# Patient Record
Sex: Female | Born: 1945 | Race: Black or African American | Hispanic: No | Smoking: Never smoker
Health system: Southern US, Community
[De-identification: ages and names within clinical notes are randomized; demographics above are authoritative.]

## PROBLEM LIST (undated history)

## (undated) DIAGNOSIS — R4701 Aphasia: Secondary | ICD-10-CM

## (undated) DIAGNOSIS — I639 Cerebral infarction, unspecified: Secondary | ICD-10-CM

## (undated) DIAGNOSIS — R531 Weakness: Secondary | ICD-10-CM

## (undated) DIAGNOSIS — I10 Essential (primary) hypertension: Secondary | ICD-10-CM

## (undated) DIAGNOSIS — I719 Aortic aneurysm of unspecified site, without rupture: Secondary | ICD-10-CM

## (undated) HISTORY — DX: Essential (primary) hypertension: I10

## (undated) HISTORY — DX: Cerebral infarction, unspecified: I63.9

## (undated) HISTORY — PX: ABDOMINAL AORTIC ANEURYSM REPAIR: SHX42

## (undated) HISTORY — DX: Aortic aneurysm of unspecified site, without rupture: I71.9

---

## 2017-01-09 DIAGNOSIS — R938 Abnormal findings on diagnostic imaging of other specified body structures: Secondary | ICD-10-CM | POA: Diagnosis not present

## 2017-01-30 DIAGNOSIS — E876 Hypokalemia: Secondary | ICD-10-CM | POA: Diagnosis not present

## 2017-01-30 DIAGNOSIS — M810 Age-related osteoporosis without current pathological fracture: Secondary | ICD-10-CM | POA: Diagnosis not present

## 2017-01-30 DIAGNOSIS — R918 Other nonspecific abnormal finding of lung field: Secondary | ICD-10-CM | POA: Diagnosis not present

## 2017-01-30 DIAGNOSIS — I82409 Acute embolism and thrombosis of unspecified deep veins of unspecified lower extremity: Secondary | ICD-10-CM | POA: Diagnosis not present

## 2017-01-30 DIAGNOSIS — G40909 Epilepsy, unspecified, not intractable, without status epilepticus: Secondary | ICD-10-CM | POA: Diagnosis not present

## 2017-01-30 DIAGNOSIS — I639 Cerebral infarction, unspecified: Secondary | ICD-10-CM | POA: Diagnosis not present

## 2017-01-30 DIAGNOSIS — E079 Disorder of thyroid, unspecified: Secondary | ICD-10-CM | POA: Diagnosis not present

## 2017-01-30 DIAGNOSIS — N189 Chronic kidney disease, unspecified: Secondary | ICD-10-CM | POA: Diagnosis not present

## 2017-01-30 DIAGNOSIS — Z79899 Other long term (current) drug therapy: Secondary | ICD-10-CM | POA: Diagnosis not present

## 2017-01-30 DIAGNOSIS — K922 Gastrointestinal hemorrhage, unspecified: Secondary | ICD-10-CM | POA: Diagnosis not present

## 2017-01-30 DIAGNOSIS — J9811 Atelectasis: Secondary | ICD-10-CM | POA: Diagnosis not present

## 2017-01-30 DIAGNOSIS — I129 Hypertensive chronic kidney disease with stage 1 through stage 4 chronic kidney disease, or unspecified chronic kidney disease: Secondary | ICD-10-CM | POA: Diagnosis not present

## 2017-01-30 DIAGNOSIS — L309 Dermatitis, unspecified: Secondary | ICD-10-CM | POA: Diagnosis not present

## 2017-01-30 DIAGNOSIS — Z87891 Personal history of nicotine dependence: Secondary | ICD-10-CM | POA: Diagnosis not present

## 2017-01-30 DIAGNOSIS — E785 Hyperlipidemia, unspecified: Secondary | ICD-10-CM | POA: Diagnosis not present

## 2017-06-26 DIAGNOSIS — R1312 Dysphagia, oropharyngeal phase: Secondary | ICD-10-CM | POA: Diagnosis not present

## 2017-06-26 DIAGNOSIS — I69391 Dysphagia following cerebral infarction: Secondary | ICD-10-CM | POA: Diagnosis not present

## 2017-07-01 DIAGNOSIS — I69351 Hemiplegia and hemiparesis following cerebral infarction affecting right dominant side: Secondary | ICD-10-CM | POA: Diagnosis not present

## 2017-07-01 DIAGNOSIS — Z681 Body mass index (BMI) 19 or less, adult: Secondary | ICD-10-CM | POA: Diagnosis not present

## 2017-07-01 DIAGNOSIS — M25561 Pain in right knee: Secondary | ICD-10-CM | POA: Diagnosis not present

## 2017-07-01 DIAGNOSIS — S72421A Displaced fracture of lateral condyle of right femur, initial encounter for closed fracture: Secondary | ICD-10-CM | POA: Diagnosis not present

## 2017-07-01 DIAGNOSIS — K259 Gastric ulcer, unspecified as acute or chronic, without hemorrhage or perforation: Secondary | ICD-10-CM | POA: Diagnosis not present

## 2017-07-01 DIAGNOSIS — M25461 Effusion, right knee: Secondary | ICD-10-CM | POA: Diagnosis not present

## 2017-07-01 DIAGNOSIS — J189 Pneumonia, unspecified organism: Secondary | ICD-10-CM | POA: Diagnosis not present

## 2017-07-01 DIAGNOSIS — R109 Unspecified abdominal pain: Secondary | ICD-10-CM | POA: Diagnosis not present

## 2017-07-01 DIAGNOSIS — E43 Unspecified severe protein-calorie malnutrition: Secondary | ICD-10-CM | POA: Diagnosis not present

## 2017-07-01 DIAGNOSIS — J159 Unspecified bacterial pneumonia: Secondary | ICD-10-CM | POA: Diagnosis not present

## 2017-07-01 DIAGNOSIS — R1084 Generalized abdominal pain: Secondary | ICD-10-CM | POA: Diagnosis not present

## 2017-07-01 DIAGNOSIS — X58XXXA Exposure to other specified factors, initial encounter: Secondary | ICD-10-CM | POA: Diagnosis not present

## 2017-07-01 DIAGNOSIS — J181 Lobar pneumonia, unspecified organism: Secondary | ICD-10-CM | POA: Diagnosis not present

## 2017-07-01 DIAGNOSIS — R1010 Upper abdominal pain, unspecified: Secondary | ICD-10-CM | POA: Diagnosis not present

## 2017-07-02 DIAGNOSIS — E876 Hypokalemia: Secondary | ICD-10-CM | POA: Diagnosis present

## 2017-07-02 DIAGNOSIS — R1312 Dysphagia, oropharyngeal phase: Secondary | ICD-10-CM | POA: Diagnosis not present

## 2017-07-02 DIAGNOSIS — Z7401 Bed confinement status: Secondary | ICD-10-CM | POA: Diagnosis not present

## 2017-07-02 DIAGNOSIS — X58XXXA Exposure to other specified factors, initial encounter: Secondary | ICD-10-CM | POA: Diagnosis not present

## 2017-07-02 DIAGNOSIS — B9681 Helicobacter pylori [H. pylori] as the cause of diseases classified elsewhere: Secondary | ICD-10-CM | POA: Diagnosis present

## 2017-07-02 DIAGNOSIS — R938 Abnormal findings on diagnostic imaging of other specified body structures: Secondary | ICD-10-CM | POA: Diagnosis not present

## 2017-07-02 DIAGNOSIS — S72421A Displaced fracture of lateral condyle of right femur, initial encounter for closed fracture: Secondary | ICD-10-CM | POA: Diagnosis present

## 2017-07-02 DIAGNOSIS — A048 Other specified bacterial intestinal infections: Secondary | ICD-10-CM | POA: Diagnosis not present

## 2017-07-02 DIAGNOSIS — R634 Abnormal weight loss: Secondary | ICD-10-CM | POA: Diagnosis not present

## 2017-07-02 DIAGNOSIS — K269 Duodenal ulcer, unspecified as acute or chronic, without hemorrhage or perforation: Secondary | ICD-10-CM | POA: Diagnosis present

## 2017-07-02 DIAGNOSIS — S72411A Displaced unspecified condyle fracture of lower end of right femur, initial encounter for closed fracture: Secondary | ICD-10-CM | POA: Diagnosis not present

## 2017-07-02 DIAGNOSIS — Z681 Body mass index (BMI) 19 or less, adult: Secondary | ICD-10-CM | POA: Diagnosis not present

## 2017-07-02 DIAGNOSIS — R112 Nausea with vomiting, unspecified: Secondary | ICD-10-CM | POA: Diagnosis not present

## 2017-07-02 DIAGNOSIS — I10 Essential (primary) hypertension: Secondary | ICD-10-CM | POA: Diagnosis present

## 2017-07-02 DIAGNOSIS — R195 Other fecal abnormalities: Secondary | ICD-10-CM | POA: Diagnosis not present

## 2017-07-02 DIAGNOSIS — K828 Other specified diseases of gallbladder: Secondary | ICD-10-CM | POA: Diagnosis present

## 2017-07-02 DIAGNOSIS — J159 Unspecified bacterial pneumonia: Secondary | ICD-10-CM | POA: Diagnosis present

## 2017-07-02 DIAGNOSIS — R1084 Generalized abdominal pain: Secondary | ICD-10-CM | POA: Diagnosis not present

## 2017-07-02 DIAGNOSIS — I69820 Aphasia following other cerebrovascular disease: Secondary | ICD-10-CM | POA: Diagnosis not present

## 2017-07-02 DIAGNOSIS — S72411D Displaced unspecified condyle fracture of lower end of right femur, subsequent encounter for closed fracture with routine healing: Secondary | ICD-10-CM | POA: Diagnosis not present

## 2017-07-02 DIAGNOSIS — R630 Anorexia: Secondary | ICD-10-CM | POA: Diagnosis not present

## 2017-07-02 DIAGNOSIS — R1010 Upper abdominal pain, unspecified: Secondary | ICD-10-CM | POA: Diagnosis not present

## 2017-07-02 DIAGNOSIS — I69351 Hemiplegia and hemiparesis following cerebral infarction affecting right dominant side: Secondary | ICD-10-CM | POA: Diagnosis not present

## 2017-07-02 DIAGNOSIS — Z8673 Personal history of transient ischemic attack (TIA), and cerebral infarction without residual deficits: Secondary | ICD-10-CM | POA: Diagnosis not present

## 2017-07-02 DIAGNOSIS — R41841 Cognitive communication deficit: Secondary | ICD-10-CM | POA: Diagnosis not present

## 2017-07-02 DIAGNOSIS — Z86718 Personal history of other venous thrombosis and embolism: Secondary | ICD-10-CM | POA: Diagnosis not present

## 2017-07-02 DIAGNOSIS — M6281 Muscle weakness (generalized): Secondary | ICD-10-CM | POA: Diagnosis not present

## 2017-07-02 DIAGNOSIS — I69320 Aphasia following cerebral infarction: Secondary | ICD-10-CM | POA: Diagnosis not present

## 2017-07-02 DIAGNOSIS — M25561 Pain in right knee: Secondary | ICD-10-CM | POA: Diagnosis not present

## 2017-07-02 DIAGNOSIS — R1011 Right upper quadrant pain: Secondary | ICD-10-CM | POA: Diagnosis not present

## 2017-07-02 DIAGNOSIS — Z87891 Personal history of nicotine dependence: Secondary | ICD-10-CM | POA: Diagnosis not present

## 2017-07-02 DIAGNOSIS — E785 Hyperlipidemia, unspecified: Secondary | ICD-10-CM | POA: Diagnosis present

## 2017-07-02 DIAGNOSIS — R6881 Early satiety: Secondary | ICD-10-CM | POA: Diagnosis not present

## 2017-07-02 DIAGNOSIS — J181 Lobar pneumonia, unspecified organism: Secondary | ICD-10-CM | POA: Diagnosis not present

## 2017-07-02 DIAGNOSIS — J189 Pneumonia, unspecified organism: Secondary | ICD-10-CM | POA: Diagnosis not present

## 2017-07-02 DIAGNOSIS — K259 Gastric ulcer, unspecified as acute or chronic, without hemorrhage or perforation: Secondary | ICD-10-CM | POA: Diagnosis present

## 2017-07-02 DIAGNOSIS — M25469 Effusion, unspecified knee: Secondary | ICD-10-CM | POA: Diagnosis not present

## 2017-07-02 DIAGNOSIS — R109 Unspecified abdominal pain: Secondary | ICD-10-CM | POA: Diagnosis not present

## 2017-07-02 DIAGNOSIS — R2689 Other abnormalities of gait and mobility: Secondary | ICD-10-CM | POA: Diagnosis not present

## 2017-07-02 DIAGNOSIS — E43 Unspecified severe protein-calorie malnutrition: Secondary | ICD-10-CM | POA: Diagnosis present

## 2017-07-02 DIAGNOSIS — R932 Abnormal findings on diagnostic imaging of liver and biliary tract: Secondary | ICD-10-CM | POA: Diagnosis not present

## 2017-07-07 DIAGNOSIS — I69320 Aphasia following cerebral infarction: Secondary | ICD-10-CM | POA: Diagnosis not present

## 2017-07-07 DIAGNOSIS — R634 Abnormal weight loss: Secondary | ICD-10-CM | POA: Diagnosis not present

## 2017-07-07 DIAGNOSIS — Z7401 Bed confinement status: Secondary | ICD-10-CM | POA: Diagnosis not present

## 2017-07-07 DIAGNOSIS — S72414A Nondisplaced unspecified condyle fracture of lower end of right femur, initial encounter for closed fracture: Secondary | ICD-10-CM | POA: Diagnosis not present

## 2017-07-07 DIAGNOSIS — R1312 Dysphagia, oropharyngeal phase: Secondary | ICD-10-CM | POA: Diagnosis not present

## 2017-07-07 DIAGNOSIS — R938 Abnormal findings on diagnostic imaging of other specified body structures: Secondary | ICD-10-CM | POA: Diagnosis not present

## 2017-07-07 DIAGNOSIS — S72411D Displaced unspecified condyle fracture of lower end of right femur, subsequent encounter for closed fracture with routine healing: Secondary | ICD-10-CM | POA: Diagnosis not present

## 2017-07-07 DIAGNOSIS — A048 Other specified bacterial intestinal infections: Secondary | ICD-10-CM | POA: Diagnosis not present

## 2017-07-07 DIAGNOSIS — Z86718 Personal history of other venous thrombosis and embolism: Secondary | ICD-10-CM | POA: Diagnosis not present

## 2017-07-07 DIAGNOSIS — E785 Hyperlipidemia, unspecified: Secondary | ICD-10-CM | POA: Diagnosis not present

## 2017-07-07 DIAGNOSIS — S72411A Displaced unspecified condyle fracture of lower end of right femur, initial encounter for closed fracture: Secondary | ICD-10-CM | POA: Diagnosis not present

## 2017-07-07 DIAGNOSIS — M6281 Muscle weakness (generalized): Secondary | ICD-10-CM | POA: Diagnosis not present

## 2017-07-07 DIAGNOSIS — I10 Essential (primary) hypertension: Secondary | ICD-10-CM | POA: Diagnosis not present

## 2017-07-07 DIAGNOSIS — J159 Unspecified bacterial pneumonia: Secondary | ICD-10-CM | POA: Diagnosis not present

## 2017-07-07 DIAGNOSIS — R2689 Other abnormalities of gait and mobility: Secondary | ICD-10-CM | POA: Diagnosis not present

## 2017-07-07 DIAGNOSIS — J181 Lobar pneumonia, unspecified organism: Secondary | ICD-10-CM | POA: Diagnosis not present

## 2017-07-07 DIAGNOSIS — R41841 Cognitive communication deficit: Secondary | ICD-10-CM | POA: Diagnosis not present

## 2017-07-07 DIAGNOSIS — Z8673 Personal history of transient ischemic attack (TIA), and cerebral infarction without residual deficits: Secondary | ICD-10-CM | POA: Diagnosis not present

## 2017-07-07 DIAGNOSIS — R1084 Generalized abdominal pain: Secondary | ICD-10-CM | POA: Diagnosis not present

## 2017-07-22 DIAGNOSIS — R634 Abnormal weight loss: Secondary | ICD-10-CM | POA: Diagnosis not present

## 2017-07-22 DIAGNOSIS — J181 Lobar pneumonia, unspecified organism: Secondary | ICD-10-CM | POA: Diagnosis not present

## 2017-07-22 DIAGNOSIS — A048 Other specified bacterial intestinal infections: Secondary | ICD-10-CM | POA: Diagnosis not present

## 2017-07-22 DIAGNOSIS — S72414A Nondisplaced unspecified condyle fracture of lower end of right femur, initial encounter for closed fracture: Secondary | ICD-10-CM | POA: Diagnosis not present

## 2017-07-24 DIAGNOSIS — Z87891 Personal history of nicotine dependence: Secondary | ICD-10-CM | POA: Diagnosis not present

## 2017-07-24 DIAGNOSIS — I69351 Hemiplegia and hemiparesis following cerebral infarction affecting right dominant side: Secondary | ICD-10-CM | POA: Diagnosis not present

## 2017-07-24 DIAGNOSIS — Z8701 Personal history of pneumonia (recurrent): Secondary | ICD-10-CM | POA: Diagnosis not present

## 2017-07-24 DIAGNOSIS — I69320 Aphasia following cerebral infarction: Secondary | ICD-10-CM | POA: Diagnosis not present

## 2017-07-24 DIAGNOSIS — Z7982 Long term (current) use of aspirin: Secondary | ICD-10-CM | POA: Diagnosis not present

## 2017-07-24 DIAGNOSIS — Z9181 History of falling: Secondary | ICD-10-CM | POA: Diagnosis not present

## 2017-07-24 DIAGNOSIS — Z86718 Personal history of other venous thrombosis and embolism: Secondary | ICD-10-CM | POA: Diagnosis not present

## 2017-07-24 DIAGNOSIS — R569 Unspecified convulsions: Secondary | ICD-10-CM | POA: Diagnosis not present

## 2017-07-24 DIAGNOSIS — S72421D Displaced fracture of lateral condyle of right femur, subsequent encounter for closed fracture with routine healing: Secondary | ICD-10-CM | POA: Diagnosis not present

## 2017-07-24 DIAGNOSIS — Z993 Dependence on wheelchair: Secondary | ICD-10-CM | POA: Diagnosis not present

## 2017-07-24 DIAGNOSIS — R32 Unspecified urinary incontinence: Secondary | ICD-10-CM | POA: Diagnosis not present

## 2017-07-28 DIAGNOSIS — Z7982 Long term (current) use of aspirin: Secondary | ICD-10-CM | POA: Diagnosis not present

## 2017-07-28 DIAGNOSIS — I69320 Aphasia following cerebral infarction: Secondary | ICD-10-CM | POA: Diagnosis not present

## 2017-07-28 DIAGNOSIS — I69351 Hemiplegia and hemiparesis following cerebral infarction affecting right dominant side: Secondary | ICD-10-CM | POA: Diagnosis not present

## 2017-07-28 DIAGNOSIS — S72421D Displaced fracture of lateral condyle of right femur, subsequent encounter for closed fracture with routine healing: Secondary | ICD-10-CM | POA: Diagnosis not present

## 2017-07-28 DIAGNOSIS — R32 Unspecified urinary incontinence: Secondary | ICD-10-CM | POA: Diagnosis not present

## 2017-07-28 DIAGNOSIS — R569 Unspecified convulsions: Secondary | ICD-10-CM | POA: Diagnosis not present

## 2017-07-29 DIAGNOSIS — S72421D Displaced fracture of lateral condyle of right femur, subsequent encounter for closed fracture with routine healing: Secondary | ICD-10-CM | POA: Diagnosis not present

## 2017-07-29 DIAGNOSIS — Z7982 Long term (current) use of aspirin: Secondary | ICD-10-CM | POA: Diagnosis not present

## 2017-07-29 DIAGNOSIS — R32 Unspecified urinary incontinence: Secondary | ICD-10-CM | POA: Diagnosis not present

## 2017-07-29 DIAGNOSIS — I69351 Hemiplegia and hemiparesis following cerebral infarction affecting right dominant side: Secondary | ICD-10-CM | POA: Diagnosis not present

## 2017-07-29 DIAGNOSIS — R569 Unspecified convulsions: Secondary | ICD-10-CM | POA: Diagnosis not present

## 2017-07-29 DIAGNOSIS — I69320 Aphasia following cerebral infarction: Secondary | ICD-10-CM | POA: Diagnosis not present

## 2017-07-31 DIAGNOSIS — R32 Unspecified urinary incontinence: Secondary | ICD-10-CM | POA: Diagnosis not present

## 2017-07-31 DIAGNOSIS — Z7982 Long term (current) use of aspirin: Secondary | ICD-10-CM | POA: Diagnosis not present

## 2017-07-31 DIAGNOSIS — R569 Unspecified convulsions: Secondary | ICD-10-CM | POA: Diagnosis not present

## 2017-07-31 DIAGNOSIS — S72421D Displaced fracture of lateral condyle of right femur, subsequent encounter for closed fracture with routine healing: Secondary | ICD-10-CM | POA: Diagnosis not present

## 2017-07-31 DIAGNOSIS — I69351 Hemiplegia and hemiparesis following cerebral infarction affecting right dominant side: Secondary | ICD-10-CM | POA: Diagnosis not present

## 2017-07-31 DIAGNOSIS — I69320 Aphasia following cerebral infarction: Secondary | ICD-10-CM | POA: Diagnosis not present

## 2017-12-22 ENCOUNTER — Ambulatory Visit: Payer: Self-pay | Admitting: Family Medicine

## 2017-12-23 ENCOUNTER — Ambulatory Visit (INDEPENDENT_AMBULATORY_CARE_PROVIDER_SITE_OTHER): Payer: Medicare Other | Admitting: Family Medicine

## 2017-12-23 ENCOUNTER — Encounter: Payer: Self-pay | Admitting: Family Medicine

## 2017-12-23 VITALS — BP 138/82 | HR 62 | Resp 16 | Ht 65.0 in | Wt 125.0 lb

## 2017-12-23 DIAGNOSIS — I69351 Hemiplegia and hemiparesis following cerebral infarction affecting right dominant side: Secondary | ICD-10-CM | POA: Diagnosis not present

## 2017-12-23 DIAGNOSIS — I10 Essential (primary) hypertension: Secondary | ICD-10-CM | POA: Diagnosis not present

## 2017-12-23 DIAGNOSIS — R531 Weakness: Secondary | ICD-10-CM

## 2017-12-23 DIAGNOSIS — IMO0001 Reserved for inherently not codable concepts without codable children: Secondary | ICD-10-CM | POA: Insufficient documentation

## 2017-12-23 DIAGNOSIS — E785 Hyperlipidemia, unspecified: Secondary | ICD-10-CM

## 2017-12-23 DIAGNOSIS — R2681 Unsteadiness on feet: Secondary | ICD-10-CM | POA: Diagnosis not present

## 2017-12-23 MED ORDER — ATORVASTATIN CALCIUM 40 MG PO TABS: 40.0000 mg | ORAL_TABLET | Freq: Every day | ORAL | 2 refills | Status: DC

## 2017-12-23 MED ORDER — LISINOPRIL-HYDROCHLOROTHIAZIDE 10-12.5 MG PO TABS: 1.0000 | ORAL_TABLET | Freq: Every day | ORAL | 2 refills | Status: DC

## 2017-12-24 LAB — COMPREHENSIVE METABOLIC PANEL
A/G RATIO: 1.1 — AB (ref 1.2–2.2)
ALK PHOS: 152 IU/L — AB (ref 39–117)
ALT: 15 IU/L (ref 0–32)
AST: 21 IU/L (ref 0–40)
Albumin: 4.8 g/dL (ref 3.5–4.8)
BILIRUBIN TOTAL: 0.4 mg/dL (ref 0.0–1.2)
BUN / CREAT RATIO: 15 (ref 12–28)
BUN: 9 mg/dL (ref 8–27)
CHLORIDE: 100 mmol/L (ref 96–106)
CO2: 25 mmol/L (ref 20–29)
Calcium: 10.3 mg/dL (ref 8.7–10.3)
Creatinine, Ser: 0.61 mg/dL (ref 0.57–1.00)
GFR calc non Af Amer: 90 mL/min/{1.73_m2} (ref >59)
GFR, EST AFRICAN AMERICAN: 104 mL/min/{1.73_m2} (ref >59)
Globulin, Total: 4.3 g/dL (ref 1.5–4.5)
Glucose: 83 mg/dL (ref 65–99)
POTASSIUM: 4.1 mmol/L (ref 3.5–5.2)
Sodium: 142 mmol/L (ref 134–144)
TOTAL PROTEIN: 9.1 g/dL — AB (ref 6.0–8.5)

## 2017-12-24 LAB — TSH: TSH: 1.77 u[IU]/mL (ref 0.450–4.500)

## 2017-12-24 LAB — CBC

## 2017-12-24 LAB — LIPID PANEL
CHOLESTEROL TOTAL: 384 mg/dL — AB (ref 100–199)
Chol/HDL Ratio: 4.3 ratio (ref 0.0–4.4)
HDL: 89 mg/dL (ref >39)
LDL Calculated: 257 mg/dL — ABNORMAL HIGH (ref 0–99)
Triglycerides: 190 mg/dL — ABNORMAL HIGH (ref 0–149)
VLDL Cholesterol Cal: 38 mg/dL (ref 5–40)

## 2017-12-24 LAB — VITAMIN B12: Vitamin B-12: 921 pg/mL (ref 232–1245)

## 2017-12-24 NOTE — Progress Notes (Signed)
Date:  12/23/2017   Name:  Belinda Hansen   DOB:  01/17/1945   MRN:  [id number]  PCP:  Adline Potter, MD    Chief Complaint: Establish Care   History of Present Illness:  This is a 72 y.o. female seen for initial visit. Previous care at [facility]. 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 On no asa or other blood thinners. HTN on Prinzide and HLD on Lipitor x yrs. No other issues noted but pt is poor historian and nothing on Care Everywhere. Declines flu, tet, pneumo, and zoster imms. Unsure if ever had colonoscopy or mammogram.  Review of Systems:  Review of Systems  Constitutional: Negative for chills and fever.  Respiratory: Negative for cough and shortness of breath.   Cardiovascular: Negative for chest pain and leg swelling.  Gastrointestinal: Negative for abdominal pain, constipation and diarrhea.  Genitourinary: Negative for difficulty urinating.  Neurological: Negative for syncope and light-headedness.    Patient Active Problem List   Diagnosis Date Noted  . Hemiparesis affecting right side as late effect of cerebrovascular accident (CVA) ([place]) 12/23/2017  . Hypertension 12/23/2017  . Hyperlipidemia 12/23/2017  . Gait instability 12/23/2017    Prior to Admission medications   Medication Sig Start Date End Date Taking? Authorizing Provider  aspirin 81 MG tablet Take 81 mg by mouth daily.   Yes [provider]  atorvastatin (LIPITOR) 40 MG tablet Take 1 tablet (40 mg total) by mouth daily. 12/23/17  Yes Castor Gittleman, Gwyndolyn Saxon, MD  lisinopril-hydrochlorothiazide (PRINZIDE,ZESTORETIC) 10-12.5 MG tablet Take 1 tablet by mouth daily. 12/23/17  Yes Andi Mahaffy, Gwyndolyn Saxon, MD    No Known Allergies  Past Surgical History:  Procedure Laterality Date  . ABDOMINAL AORTIC ANEURYSM REPAIR      Social History   Tobacco Use  . Smoking status: Never Smoker  . Smokeless tobacco: Never Used  Substance Use Topics  . Alcohol use: No    Frequency: Never  . Drug use: No     Family History  Family history unknown: Yes    Medication list has been reviewed and updated.  Physical Examination: BP 138/82   Pulse 62   Resp 16   Ht 5\' 5"  (1.651 m)   Wt 125 lb (56.7 kg)   SpO2 98%   BMI 20.80 kg/m   Physical Exam  Constitutional: She appears well-developed and well-nourished.  Generally weak  HENT:  Head: Normocephalic and atraumatic.  Right Ear: External ear normal.  Nose: Nose normal.  Mouth/Throat: Oropharynx is clear and moist.  L EAC occluded by cerumen  Eyes: Conjunctivae and EOM are normal. Pupils are equal, round, and reactive to light.  Neck: Neck supple. No thyromegaly present.  Cardiovascular: Normal rate, regular rhythm and normal heart sounds.  Pulmonary/Chest: Effort normal and breath sounds normal.  Abdominal: Soft. She exhibits no distension. There is no tenderness.  Musculoskeletal: She exhibits no edema.  Lymphadenopathy:    She has no cervical adenopathy.  Neurological: She is alert.  Dense R hemiparesis No tremor, unable to ambulate  Skin: Skin is warm and dry.  Psychiatric: She has a normal mood and affect. Her behavior is normal.  Nursing note and vitals reviewed.   Assessment and Plan:  1. Hemiparesis affecting right side as late effect of cerebrovascular accident (CVA) ([place]) Stable x 20 yrs, add asa  2. Essential hypertension Adequate control on Prinzide, refill - Comprehensive Metabolic Panel (CMET) - CBC  3. Hyperlipidemia, unspecified hyperlipidemia type Unclear control on Lipitor, refill -  Lipid Profile  4. Gait instability - B12  5. Weakness - TSH  Return in about 4 weeks (around 01/20/2018).   45 mins spent with patient over half in counseling  Satira Anis. [facility]  12/24/2017

## 2018-01-21 ENCOUNTER — Ambulatory Visit: Payer: Medicare Other | Admitting: Family Medicine

## 2018-02-25 ENCOUNTER — Other Ambulatory Visit: Payer: Self-pay

## 2018-02-25 MED ORDER — LISINOPRIL-HYDROCHLOROTHIAZIDE 10-12.5 MG PO TABS: 1.0000 | ORAL_TABLET | Freq: Every day | ORAL | 2 refills | Status: DC

## 2018-02-25 MED ORDER — ATORVASTATIN CALCIUM 40 MG PO TABS: 40.0000 mg | ORAL_TABLET | Freq: Every day | ORAL | 2 refills | Status: DC

## 2018-04-07 ENCOUNTER — Telehealth: Payer: Self-pay

## 2018-04-07 ENCOUNTER — Other Ambulatory Visit: Payer: Self-pay | Admitting: Family Medicine

## 2018-04-07 MED ORDER — LISINOPRIL-HYDROCHLOROTHIAZIDE 10-12.5 MG PO TABS: 1.0000 | ORAL_TABLET | Freq: Every day | ORAL | 2 refills | Status: DC

## 2018-04-07 MED ORDER — ATORVASTATIN CALCIUM 40 MG PO TABS: 40.0000 mg | ORAL_TABLET | Freq: Every day | ORAL | 2 refills | Status: DC

## 2018-04-07 NOTE — Telephone Encounter (Signed)
Done x 90d

## 2018-04-07 NOTE — Telephone Encounter (Signed)
Please send in all meds we prescribe or can prior to leaving today. Will set up new PCP today.

## 2018-05-07 ENCOUNTER — Encounter: Payer: Self-pay | Admitting: Internal Medicine

## 2018-05-07 ENCOUNTER — Ambulatory Visit (INDEPENDENT_AMBULATORY_CARE_PROVIDER_SITE_OTHER): Payer: Medicare Other | Admitting: Internal Medicine

## 2018-05-07 VITALS — BP 142/84 | HR 98 | Resp 16 | Ht 65.0 in | Wt 130.0 lb

## 2018-05-07 DIAGNOSIS — E785 Hyperlipidemia, unspecified: Secondary | ICD-10-CM | POA: Diagnosis not present

## 2018-05-07 DIAGNOSIS — R4701 Aphasia: Secondary | ICD-10-CM | POA: Diagnosis not present

## 2018-05-07 DIAGNOSIS — I69351 Hemiplegia and hemiparesis following cerebral infarction affecting right dominant side: Secondary | ICD-10-CM | POA: Diagnosis not present

## 2018-05-07 DIAGNOSIS — I499 Cardiac arrhythmia, unspecified: Secondary | ICD-10-CM | POA: Diagnosis not present

## 2018-05-07 DIAGNOSIS — I10 Essential (primary) hypertension: Secondary | ICD-10-CM | POA: Diagnosis not present

## 2018-05-07 DIAGNOSIS — I493 Ventricular premature depolarization: Secondary | ICD-10-CM | POA: Diagnosis not present

## 2018-05-07 MED ORDER — LISINOPRIL-HYDROCHLOROTHIAZIDE 10-12.5 MG PO TABS: 1.0000 | ORAL_TABLET | Freq: Every day | ORAL | 5 refills | Status: DC

## 2018-05-07 MED ORDER — ATORVASTATIN CALCIUM 40 MG PO TABS: 40.0000 mg | ORAL_TABLET | Freq: Every day | ORAL | 5 refills | Status: DC

## 2018-05-07 NOTE — Progress Notes (Signed)
Date:  05/07/2018   Name:  Belinda Hansen   DOB:  01/17/1945   MRN:  [id number]  Patient is here today with her nephew.  She lives independently with her sister. Chief Complaint: Hypertension and Hyperlipidemia Hypertension  This is a chronic problem. The problem is unchanged. The problem is controlled. Pertinent negatives include no chest pain, headaches, palpitations or shortness of breath. Past treatments include ACE inhibitors and diuretics. The current treatment provides significant improvement. Hypertensive end-organ damage includes kidney disease and CVA. There is no history of CAD/MI.  Hyperlipidemia  This is a chronic problem. Pertinent negatives include no chest pain or shortness of breath. Current antihyperlipidemic treatment includes statins (recently restarted meds). There are no compliance problems.   CVA - old stroke from 2007 in [place].  On aspirin.  Non ambulatory.  She was left with a dense right hemiparesis and speech impairment expressive aphasia.  She gets around with a scooter and is very independent.      Review of Systems  Constitutional: Negative for chills, fatigue and unexpected weight change.  Respiratory: Negative for chest tightness, shortness of breath and wheezing.   Cardiovascular: Negative for chest pain and palpitations.  Gastrointestinal: Negative for constipation.  Musculoskeletal: Positive for gait problem. Negative for arthralgias and joint swelling.  Skin: Negative for color change.  Allergic/Immunologic: Negative for environmental allergies.  Neurological: Negative for dizziness and headaches.  Hematological: Negative for adenopathy.  Psychiatric/Behavioral: Negative for dysphoric mood and sleep disturbance.    Patient Active Problem List   Diagnosis Date Noted  . 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 Hemiparesis affecting right side as late effect of cerebrovascular accident (CVA) ([place]) 12/23/2017  . Hypertension  12/23/2017  . Hyperlipidemia 12/23/2017  . Gait instability 12/23/2017    Prior to Admission medications   Medication Sig Start Date End Date Taking? Authorizing Provider  aspirin 81 MG tablet Take 81 mg by mouth daily.   Yes [provider]  atorvastatin (LIPITOR) 40 MG tablet Take 1 tablet (40 mg total) by mouth daily. 04/07/18  Yes Plonk, Gwyndolyn Saxon, MD  lisinopril-hydrochlorothiazide (PRINZIDE,ZESTORETIC) 10-12.5 MG tablet Take 1 tablet by mouth daily. 04/07/18  Yes Plonk, Gwyndolyn Saxon, MD    No Known Allergies  Past Surgical History:  Procedure Laterality Date  . ABDOMINAL AORTIC ANEURYSM REPAIR      Social History   Tobacco Use  . Smoking status: Never Smoker  . Smokeless tobacco: Never Used  Substance Use Topics  . Alcohol use: No    Frequency: Never  . Drug use: No     Medication list has been reviewed and updated.  Current Meds  Medication Sig  . aspirin 81 MG tablet Take 81 mg by mouth daily.  Marland Kitchen atorvastatin (LIPITOR) 40 MG tablet Take 1 tablet (40 mg total) by mouth daily.  Marland Kitchen lisinopril-hydrochlorothiazide (PRINZIDE,ZESTORETIC) 10-12.5 MG tablet Take 1 tablet by mouth daily.  . [DISCONTINUED] atorvastatin (LIPITOR) 40 MG tablet Take 1 tablet (40 mg total) by mouth daily.  . [DISCONTINUED] lisinopril-hydrochlorothiazide (PRINZIDE,ZESTORETIC) 10-12.5 MG tablet Take 1 tablet by mouth daily.    PHQ 2/9 Scores 05/07/2018 12/23/2017 12/23/2017  PHQ - 2 Score 0 0 0  Exception Documentation - Patient refusal -  Not completed - patient unable to explain patient unable to answer     Physical Exam  Constitutional: She is oriented to person, place, and time. She appears well-developed. No distress.  HENT:  Head: Normocephalic and atraumatic.  Cardiovascular: Normal heart sounds.  An irregularly irregular rhythm present.  No murmur heard. Pulses:      Dorsalis pedis pulses are 1+ on the left side.       Posterior tibial pulses are 1+ on the left side.  Pulmonary/Chest:  Effort normal and breath sounds normal. No respiratory distress. She has no wheezes. She has no rhonchi.  Abdominal: Soft. Normal appearance.  Musculoskeletal: Normal range of motion. She exhibits no edema.  Neurological: She is alert and oriented to person, place, and time. She displays atrophy (RUE and RLE).  Skin: Skin is warm and dry. No rash noted.  Changes of vitiligo  Psychiatric: Her behavior is normal. Thought content normal. Her affect is inappropriate (laughing more than appropriate). Her speech is slurred (with moderate expressive aphasia).  Nursing note and vitals reviewed.   BP (!) 142/84   Pulse 98   Resp 16   Ht 5\' 5"  (1.651 m)   Wt 130 lb (59 kg) Comment: patient reports  SpO2 97%   BMI 21.63 kg/m   Assessment and Plan: 1. Essential hypertension Controlled, continue current medications - lisinopril-hydrochlorothiazide (PRINZIDE,ZESTORETIC) 10-12.5 MG tablet; Take 1 tablet by mouth daily.  Dispense: 30 tablet; Refill: 5 - TSH - CBC with Differential/Platelet  2. Hemiparesis affecting right side as late effect of cerebrovascular accident (CVA) ([place]) Continue aspirin  3. Hyperlipidemia, unspecified hyperlipidemia type Now back on medications Check labs - atorvastatin (LIPITOR) 40 MG tablet; Take 1 tablet (40 mg total) by mouth daily.  Dispense: 30 tablet; Refill: 5 - Lipid panel - Comprehensive metabolic panel  4. Cardiac arrhythmia, unspecified cardiac arrhythmia type asymptomatic - EKG 12-Lead - SR with frequent PVCs  5. Expressive aphasia Unchanged per nephew  6. Asymptomatic PVCs Check labs, including magnesium - Magnesium   Meds ordered this encounter  Medications  . lisinopril-hydrochlorothiazide (PRINZIDE,ZESTORETIC) 10-12.5 MG tablet    Sig: Take 1 tablet by mouth daily.    Dispense:  30 tablet    Refill:  5  . atorvastatin (LIPITOR) 40 MG tablet    Sig: Take 1 tablet (40 mg total) by mouth daily.    Dispense:  30 tablet    Refill:  5     Partially dictated using Editor, commissioning. Any errors are unintentional.  Halina Maidens, MD [facility]  05/07/2018   There are no diagnoses linked to this encounter.

## 2018-05-08 LAB — CBC WITH DIFFERENTIAL/PLATELET
BASOS ABS: 0 10*3/uL (ref 0.0–0.2)
Basos: 0 %
EOS (ABSOLUTE): 0 10*3/uL (ref 0.0–0.4)
Eos: 1 %
Hematocrit: 42 % (ref 34.0–46.6)
Hemoglobin: 13.8 g/dL (ref 11.1–15.9)
IMMATURE GRANS (ABS): 0 10*3/uL (ref 0.0–0.1)
Immature Granulocytes: 0 %
LYMPHS ABS: 1.9 10*3/uL (ref 0.7–3.1)
LYMPHS: 30 %
MCH: 28.9 pg (ref 26.6–33.0)
MCHC: 32.9 g/dL (ref 31.5–35.7)
MCV: 88 fL (ref 79–97)
MONOS ABS: 0.4 10*3/uL (ref 0.1–0.9)
Monocytes: 6 %
NEUTROS ABS: 4 10*3/uL (ref 1.4–7.0)
Neutrophils: 63 %
PLATELETS: 232 10*3/uL (ref 150–450)
RBC: 4.77 x10E6/uL (ref 3.77–5.28)
RDW: 14.8 % (ref 12.3–15.4)
WBC: 6.3 10*3/uL (ref 3.4–10.8)

## 2018-05-08 LAB — LIPID PANEL
CHOLESTEROL TOTAL: 224 mg/dL — AB (ref 100–199)
Chol/HDL Ratio: 2.7 ratio (ref 0.0–4.4)
HDL: 82 mg/dL (ref >39)
LDL Calculated: 94 mg/dL (ref 0–99)
TRIGLYCERIDES: 242 mg/dL — AB (ref 0–149)
VLDL CHOLESTEROL CAL: 48 mg/dL — AB (ref 5–40)

## 2018-05-08 LAB — COMPREHENSIVE METABOLIC PANEL
A/G RATIO: 1.3 (ref 1.2–2.2)
ALT: 14 IU/L (ref 0–32)
AST: 14 IU/L (ref 0–40)
Albumin: 4.3 g/dL (ref 3.5–4.8)
Alkaline Phosphatase: 147 IU/L — ABNORMAL HIGH (ref 39–117)
BILIRUBIN TOTAL: 0.4 mg/dL (ref 0.0–1.2)
BUN/Creatinine Ratio: 15 (ref 12–28)
BUN: 14 mg/dL (ref 8–27)
CHLORIDE: 102 mmol/L (ref 96–106)
CO2: 25 mmol/L (ref 20–29)
Calcium: 9.6 mg/dL (ref 8.7–10.3)
Creatinine, Ser: 0.95 mg/dL (ref 0.57–1.00)
GFR calc non Af Amer: 59 mL/min/{1.73_m2} — ABNORMAL LOW (ref >59)
GFR, EST AFRICAN AMERICAN: 68 mL/min/{1.73_m2} (ref >59)
GLOBULIN, TOTAL: 3.2 g/dL (ref 1.5–4.5)
Glucose: 81 mg/dL (ref 65–99)
Potassium: 4.2 mmol/L (ref 3.5–5.2)
SODIUM: 141 mmol/L (ref 134–144)
TOTAL PROTEIN: 7.5 g/dL (ref 6.0–8.5)

## 2018-05-08 LAB — MAGNESIUM: MAGNESIUM: 2 mg/dL (ref 1.6–2.3)

## 2018-05-08 LAB — TSH: TSH: 2.11 u[IU]/mL (ref 0.450–4.500)

## 2018-06-04 ENCOUNTER — Telehealth: Payer: Self-pay

## 2018-06-04 NOTE — Telephone Encounter (Signed)
Rcvd a message from call center that patient wants refill on cream. There is no cream, lotion, or ointment listed in present or past med lists from [place]. Advised to call specialists or see if it was OTC and contact pharmacy.

## 2018-09-13 ENCOUNTER — Encounter: Payer: Self-pay | Admitting: Internal Medicine

## 2018-09-14 ENCOUNTER — Ambulatory Visit (INDEPENDENT_AMBULATORY_CARE_PROVIDER_SITE_OTHER): Payer: Medicare Other | Admitting: Internal Medicine

## 2018-09-14 ENCOUNTER — Encounter: Payer: Self-pay | Admitting: Internal Medicine

## 2018-09-14 ENCOUNTER — Ambulatory Visit
Admission: RE | Admit: 2018-09-14 | Discharge: 2018-09-14 | Disposition: A | Discharge status: Home / Self Care | Payer: Medicare Other | Source: Ambulatory Visit | Attending: Internal Medicine | Admitting: Internal Medicine

## 2018-09-14 VITALS — BP 132/84 | HR 76 | Ht 65.0 in

## 2018-09-14 DIAGNOSIS — Z8673 Personal history of transient ischemic attack (TIA), and cerebral infarction without residual deficits: Secondary | ICD-10-CM | POA: Diagnosis not present

## 2018-09-14 DIAGNOSIS — I10 Essential (primary) hypertension: Secondary | ICD-10-CM | POA: Diagnosis not present

## 2018-09-14 DIAGNOSIS — M79604 Pain in right leg: Secondary | ICD-10-CM | POA: Diagnosis not present

## 2018-09-14 DIAGNOSIS — I69351 Hemiplegia and hemiparesis following cerebral infarction affecting right dominant side: Secondary | ICD-10-CM

## 2018-09-14 DIAGNOSIS — E782 Mixed hyperlipidemia: Secondary | ICD-10-CM | POA: Diagnosis not present

## 2018-09-14 DIAGNOSIS — M25561 Pain in right knee: Secondary | ICD-10-CM | POA: Diagnosis not present

## 2018-09-14 DIAGNOSIS — M85861 Other specified disorders of bone density and structure, right lower leg: Secondary | ICD-10-CM | POA: Insufficient documentation

## 2018-09-14 NOTE — Progress Notes (Addendum)
Date:  09/14/2018   Name:  Belinda Hansen   DOB:  03-23-1946   MRN:  [id number]   Chief Complaint: Leg Pain (Right leg pain.)  Hypertension  This is a chronic problem. The problem is controlled. Pertinent negatives include no chest pain, headaches, palpitations or shortness of breath. Past treatments include ACE inhibitors. The current treatment provides significant improvement. Hypertensive end-organ damage includes CVA.  Hyperlipidemia  This is a chronic problem. The problem is controlled. Associated symptoms include leg pain. Pertinent negatives include no chest pain or shortness of breath. Current antihyperlipidemic treatment includes statins. The current treatment provides significant improvement of lipids.  Leg Pain   There was no injury mechanism. The pain is present in the right thigh and right knee. The pain is moderate. Associated symptoms comments: Same side a stroke and affected by dense hemiparesis.  There was a question of shoulder pain but pateint did not reveal any pain in the RUE with palpation and ROM. She denies a fall or other injury to her leg.  She is non ambulatory - uses her scooter for all ADLs and transportation to appointments outside the home.   Lab Results  Component Value Date   CREATININE 0.95 05/07/2018   BUN 14 05/07/2018   NA 141 05/07/2018   K 4.2 05/07/2018   CL 102 05/07/2018   CO2 25 05/07/2018   Lab Results  Component Value Date   CHOL 224 (H) 05/07/2018   HDL 82 05/07/2018   LDLCALC 94 05/07/2018   TRIG 242 (H) 05/07/2018   CHOLHDL 2.7 05/07/2018    ROS limited by expressive aphasia but pt able to answer simple questions. Review of Systems  Constitutional: Negative for chills, fatigue and fever.  Respiratory: Negative for chest tightness, shortness of breath and wheezing.   Cardiovascular: Positive for leg swelling. Negative for chest pain and palpitations.  Musculoskeletal: Positive for arthralgias and gait problem.  Neurological:  Positive for weakness. Negative for dizziness and headaches.    Patient Active Problem List   Diagnosis Date Noted  . Mixed hyperlipidemia 09/14/2018  . 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 Hemiparesis affecting right side as late effect of cerebrovascular accident (CVA) ([place]) 12/23/2017  . Essential hypertension 12/23/2017  . Wheel chair as ambulatory aid 12/23/2017    No Known Allergies  Past Surgical History:  Procedure Laterality Date  . ABDOMINAL AORTIC ANEURYSM REPAIR      Social History   Tobacco Use  . Smoking status: Never Smoker  . Smokeless tobacco: Never Used  Substance Use Topics  . Alcohol use: No    Frequency: Never  . Drug use: No     Medication list has been reviewed and updated.  Current Meds  Medication Sig  . aspirin 81 MG tablet Take 81 mg by mouth daily.  Marland Kitchen atorvastatin (LIPITOR) 40 MG tablet Take 1 tablet (40 mg total) by mouth daily.  Marland Kitchen lisinopril-hydrochlorothiazide (PRINZIDE,ZESTORETIC) 10-12.5 MG tablet Take 1 tablet by mouth daily.    PHQ 2/9 Scores 05/07/2018 12/23/2017 12/23/2017  PHQ - 2 Score 0 0 0  Exception Documentation - Patient refusal -  Not completed - patient unable to explain patient unable to answer     Physical Exam Vitals signs and nursing note reviewed.  Constitutional:      General: She is not in acute distress.    Appearance: She is well-developed.     Comments: Patient seen sitting in power mobility device.  HENT:  Head: Normocephalic and atraumatic.  Neck:     Musculoskeletal: Normal range of motion and neck supple.  Cardiovascular:     Rate and Rhythm: Normal rate and regular rhythm.     Heart sounds: Normal heart sounds.  Pulmonary:     Effort: Pulmonary effort is normal. No respiratory distress.     Breath sounds: Normal breath sounds.  Musculoskeletal:     Right knee: She exhibits swelling. She exhibits no effusion.     Comments: Right thigh tender to touch ROM right knee  normal passively Bony swelling of right knee noted with no effusion LE muscles atrophied bilaterally  Skin:    General: Skin is warm and dry.     Findings: No rash.  Neurological:     Mental Status: She is alert and oriented to person, place, and time.     Comments: Dense right UE and LE paresis  Psychiatric:        Mood and Affect: Mood and affect normal.        Speech: She is communicative (moderate expressive aphasia).        Behavior: Behavior normal.     BP 132/84 (BP Location: Left Arm, Patient Position: Sitting, Cuff Size: Normal)   Pulse 76   Ht 5\' 5"  (1.651 m)   SpO2 100%   BMI 21.63 kg/m   Assessment and Plan: 1. Essential hypertension controlled  2. Hemiparesis affecting right side as late effect of cerebrovascular accident (CVA) ([place]) Stable with expressive aphasia  3. Mixed hyperlipidemia On statin therapy  4. Diffuse pain in right lower extremity Recommend tylenol 650 mg tid as needed - DG Knee Complete 4 Views Right; Future  Pt does not want any health maintenance testing, and no vaccinations.  Partially dictated using Editor, commissioning. Any errors are unintentional.  Halina Maidens, MD [facility]  09/14/2018

## 2018-09-14 NOTE — Patient Instructions (Signed)
Take Tylenol 650 mg three times a day for knee pain

## 2018-12-09 ENCOUNTER — Encounter: Payer: Self-pay | Admitting: Emergency Medicine

## 2018-12-09 ENCOUNTER — Ambulatory Visit
Admission: EM | Admit: 2018-12-09 | Discharge: 2018-12-09 | Disposition: A | Discharge status: Home / Self Care | Payer: Medicare Other | Attending: Family Medicine | Admitting: Family Medicine

## 2018-12-09 ENCOUNTER — Other Ambulatory Visit: Payer: Self-pay

## 2018-12-09 ENCOUNTER — Ambulatory Visit: Payer: Medicare Other

## 2018-12-09 DIAGNOSIS — I10 Essential (primary) hypertension: Secondary | ICD-10-CM | POA: Diagnosis not present

## 2018-12-09 DIAGNOSIS — Z79899 Other long term (current) drug therapy: Secondary | ICD-10-CM | POA: Insufficient documentation

## 2018-12-09 DIAGNOSIS — E782 Mixed hyperlipidemia: Secondary | ICD-10-CM | POA: Diagnosis not present

## 2018-12-09 DIAGNOSIS — Y92009 Unspecified place in unspecified non-institutional (private) residence as the place of occurrence of the external cause: Secondary | ICD-10-CM | POA: Diagnosis not present

## 2018-12-09 DIAGNOSIS — W07XXXA Fall from chair, initial encounter: Secondary | ICD-10-CM | POA: Diagnosis not present

## 2018-12-09 DIAGNOSIS — Z7982 Long term (current) use of aspirin: Secondary | ICD-10-CM | POA: Diagnosis not present

## 2018-12-09 DIAGNOSIS — W19XXXA Unspecified fall, initial encounter: Secondary | ICD-10-CM | POA: Insufficient documentation

## 2018-12-09 DIAGNOSIS — M25561 Pain in right knee: Secondary | ICD-10-CM | POA: Diagnosis not present

## 2018-12-09 DIAGNOSIS — M79662 Pain in left lower leg: Secondary | ICD-10-CM | POA: Diagnosis not present

## 2018-12-09 DIAGNOSIS — S8992XA Unspecified injury of left lower leg, initial encounter: Secondary | ICD-10-CM | POA: Diagnosis not present

## 2018-12-09 DIAGNOSIS — S8012XA Contusion of left lower leg, initial encounter: Secondary | ICD-10-CM | POA: Diagnosis not present

## 2018-12-09 DIAGNOSIS — S8991XA Unspecified injury of right lower leg, initial encounter: Secondary | ICD-10-CM | POA: Diagnosis not present

## 2018-12-09 DIAGNOSIS — S8001XA Contusion of right knee, initial encounter: Secondary | ICD-10-CM

## 2018-12-09 NOTE — ED Triage Notes (Signed)
Pt c/o left leg pain, swelling and discoloration. She reports that it started this evening. She fell 3 days ago.

## 2018-12-09 NOTE — ED Provider Notes (Signed)
[facility]    CSN: [id number] Arrival date & time: 12/09/18  1902     History   Chief Complaint Chief Complaint  Patient presents with  . Leg Pain    left    HPI Belinda Hansen is a 72 y.o. female.   72 yo female with a c/o left lower leg pain and right knee pain with swelling since falling at home 3 days ago, trying to get out of her chair. Patient has a h/o stroke with residual right sided hemiparesis.    Leg Pain    Past Medical History:  Diagnosis Date  . Aortic aneurysm ([place])   . Hypertension   . Stroke [facility]     Patient Active Problem List   Diagnosis Date Noted  . Mixed hyperlipidemia 09/14/2018  . 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 Hemiparesis affecting right side as late effect of cerebrovascular accident (CVA) ([place]) 12/23/2017  . Essential hypertension 12/23/2017  . Wheel chair as ambulatory aid 12/23/2017    Past Surgical History:  Procedure Laterality Date  . ABDOMINAL AORTIC ANEURYSM REPAIR      OB History   No obstetric history on file.      Home Medications    Prior to Admission medications   Medication Sig Start Date End Date Taking? Authorizing Provider  aspirin 81 MG tablet Take 81 mg by mouth daily.   Yes [provider]  atorvastatin (LIPITOR) 40 MG tablet Take 1 tablet (40 mg total) by mouth daily. 05/07/18  Yes Glean Hess, MD  lisinopril-hydrochlorothiazide (PRINZIDE,ZESTORETIC) 10-12.5 MG tablet Take 1 tablet by mouth daily. 05/07/18  Yes Glean Hess, MD    Family History Family History  Family history unknown: Yes    Social History Social History   Tobacco Use  . Smoking status: Never Smoker  . Smokeless tobacco: Never Used  Substance Use Topics  . Alcohol use: Yes    Frequency: Never    Comment: beer occasionally  . Drug use: No     Allergies   Patient has no known allergies.   Review of Systems Review of Systems   Physical Exam Triage  Vital Signs ED Triage Vitals  Enc Vitals Group     BP 12/09/18 1944 (!) 176/96     Pulse Rate 12/09/18 1944 96     Resp 12/09/18 1944 18     Temp 12/09/18 1944 98.1 F (36.7 C)     Temp Source 12/09/18 1944 Oral     SpO2 12/09/18 1944 99 %     Weight 12/09/18 1939 170 lb (77.1 kg)     Height 12/09/18 1939 5\' 6"  (1.676 m)     Head Circumference --      Peak Flow --      Pain Score 12/09/18 1938 8     Pain Loc --      Pain Edu? --      Excl. in [place]? --    No data found.  Updated Vital Signs BP (!) 176/96 (BP Location: Left Arm)   Pulse 96   Temp 98.1 F (36.7 C) (Oral)   Resp 18   Ht 5\' 6"  (1.676 m)   Wt 77.1 kg   SpO2 99%   BMI 27.44 kg/m   Visual Acuity Right Eye Distance:   Left Eye Distance:   Bilateral Distance:    Right Eye Near:   Left Eye Near:    Bilateral Near:     Physical  Exam Vitals signs and nursing note reviewed.  Constitutional:      General: She is not in acute distress.    Appearance: Normal appearance. She is not ill-appearing, toxic-appearing or diaphoretic.  Musculoskeletal:     Right knee: She exhibits swelling. She exhibits normal range of motion, no deformity, no laceration, no erythema, normal alignment, no LCL laxity, normal patellar mobility, normal meniscus and no MCL laxity. Tenderness (diffuse, anterior) found.     Left lower leg: She exhibits tenderness, bony tenderness and swelling (mild). She exhibits no deformity and no laceration. No edema.  Neurological:     Mental Status: She is alert.      UC Treatments / Results  Labs (all labs ordered are listed, but only abnormal results are displayed) Labs Reviewed - No data to display  EKG None  Radiology Dg Tibia/fibula Left  Result Date: 12/09/2018 CLINICAL DATA:  Fall 3 days ago with left leg pain, initial encounter EXAM: LEFT TIBIA AND FIBULA - 2 VIEW COMPARISON:  None. FINDINGS: There is no evidence of fracture or other focal bone lesions. Soft tissues are unremarkable.  IMPRESSION: No acute abnormality noted. Electronically Signed   By: Inez Catalina M.D.   On: 12/09/2018 21:15   Dg Knee Ap/lat W/sunrise Right  Result Date: 12/09/2018 CLINICAL DATA:  Recent fall 3 days ago with right knee pain, initial encounter EXAM: RIGHT KNEE 3 VIEWS COMPARISON:  09/14/2018 FINDINGS: Mild osteopenia is noted. No acute fracture or dislocation is seen. No joint effusion is noted. Diffuse vascular calcifications are noted. IMPRESSION: No acute abnormality seen. Electronically Signed   By: Inez Catalina M.D.   On: 12/09/2018 21:15    Procedures Procedures (including critical care time)  Medications Ordered in UC Medications - No data to display  Initial Impression / Assessment and Plan / UC Course  I have reviewed the triage vital signs and the nursing notes.  Pertinent labs & imaging results that were available during my care of the patient were reviewed by me and considered in my medical decision making (see chart for details).      Final Clinical Impressions(s) / UC Diagnoses   Final diagnoses:  Contusion of left lower extremity, initial encounter  Contusion of right knee, initial encounter    ED Prescriptions    None     1. x-ray results and diagnosis reviewed with patient and caregiver accompanying patient 2. rx as per orders above; reviewed possible side effects, interactions, risks and benefits  3. Recommend supportive treatment with rest, ice, elevation, tylenol as needed 4. Follow-up prn if symptoms worsen or don't improve   Controlled Substance Prescriptions [place] Controlled Substance Registry consulted? Not Applicable   Norval Gable, MD 12/09/18 2122

## 2018-12-09 NOTE — Discharge Instructions (Addendum)
Rest, ice, elevation, over the counter tylenol

## 2019-04-10 ENCOUNTER — Encounter: Payer: Self-pay | Admitting: Emergency Medicine

## 2019-04-10 ENCOUNTER — Other Ambulatory Visit: Payer: Self-pay

## 2019-04-10 ENCOUNTER — Emergency Department
Admission: EM | Admit: 2019-04-10 | Discharge: 2019-04-10 | Disposition: A | Discharge status: Home / Self Care | Payer: Medicare Other | Attending: Emergency Medicine | Admitting: Emergency Medicine

## 2019-04-10 ENCOUNTER — Emergency Department: Payer: Medicare Other

## 2019-04-10 DIAGNOSIS — I10 Essential (primary) hypertension: Secondary | ICD-10-CM | POA: Insufficient documentation

## 2019-04-10 DIAGNOSIS — R600 Localized edema: Secondary | ICD-10-CM | POA: Diagnosis not present

## 2019-04-10 DIAGNOSIS — R2241 Localized swelling, mass and lump, right lower limb: Secondary | ICD-10-CM | POA: Diagnosis not present

## 2019-04-10 DIAGNOSIS — Z8673 Personal history of transient ischemic attack (TIA), and cerebral infarction without residual deficits: Secondary | ICD-10-CM | POA: Diagnosis not present

## 2019-04-10 DIAGNOSIS — Z79899 Other long term (current) drug therapy: Secondary | ICD-10-CM | POA: Insufficient documentation

## 2019-04-10 DIAGNOSIS — R609 Edema, unspecified: Secondary | ICD-10-CM

## 2019-04-10 DIAGNOSIS — Z7982 Long term (current) use of aspirin: Secondary | ICD-10-CM | POA: Insufficient documentation

## 2019-04-10 DIAGNOSIS — M7989 Other specified soft tissue disorders: Secondary | ICD-10-CM

## 2019-04-10 HISTORY — DX: Aphasia: R47.01

## 2019-04-10 HISTORY — DX: Weakness: R53.1

## 2019-04-10 HISTORY — DX: Cerebral infarction, unspecified: I63.9

## 2019-04-10 LAB — CBC WITH DIFFERENTIAL/PLATELET
Abs Immature Granulocytes: 0.03 10*3/uL (ref 0.00–0.07)
Basophils Absolute: 0.1 10*3/uL (ref 0.0–0.1)
Basophils Relative: 1 %
Eosinophils Absolute: 0.3 10*3/uL (ref 0.0–0.5)
Eosinophils Relative: 3 %
HCT: 41.6 % (ref 36.0–46.0)
Hemoglobin: 13.1 g/dL (ref 12.0–15.0)
Immature Granulocytes: 0 %
Lymphocytes Relative: 18 %
Lymphs Abs: 1.7 10*3/uL (ref 0.7–4.0)
MCH: 29.4 pg (ref 26.0–34.0)
MCHC: 31.5 g/dL (ref 30.0–36.0)
MCV: 93.3 fL (ref 80.0–100.0)
Monocytes Absolute: 0.8 10*3/uL (ref 0.1–1.0)
Monocytes Relative: 8 %
Neutro Abs: 6.9 10*3/uL (ref 1.7–7.7)
Neutrophils Relative %: 70 %
Platelets: 383 10*3/uL (ref 150–400)
RBC: 4.46 MIL/uL (ref 3.87–5.11)
RDW: 15 % (ref 11.5–15.5)
WBC: 9.8 10*3/uL (ref 4.0–10.5)
nRBC: 0 % (ref 0.0–0.2)

## 2019-04-10 LAB — COMPREHENSIVE METABOLIC PANEL
ALT: 19 U/L (ref 0–44)
AST: 27 U/L (ref 15–41)
Albumin: 4.3 g/dL (ref 3.5–5.0)
Alkaline Phosphatase: 148 U/L — ABNORMAL HIGH (ref 38–126)
Anion gap: 12 (ref 5–15)
BUN: 17 mg/dL (ref 8–23)
CO2: 23 mmol/L (ref 22–32)
Calcium: 9.4 mg/dL (ref 8.9–10.3)
Chloride: 106 mmol/L (ref 98–111)
Creatinine, Ser: 0.78 mg/dL (ref 0.44–1.00)
GFR calc Af Amer: >60 mL/min (ref >60)
GFR calc non Af Amer: >60 mL/min (ref >60)
Glucose, Bld: 111 mg/dL — ABNORMAL HIGH (ref 70–99)
Potassium: 3.7 mmol/L (ref 3.5–5.1)
Sodium: 141 mmol/L (ref 135–145)
Total Bilirubin: 0.3 mg/dL (ref 0.3–1.2)
Total Protein: 7.6 g/dL (ref 6.5–8.1)

## 2019-04-10 LAB — BRAIN NATRIURETIC PEPTIDE: B Natriuretic Peptide: 165 pg/mL — ABNORMAL HIGH (ref 0.0–100.0)

## 2019-04-10 MED ORDER — FUROSEMIDE 20 MG PO TABS: 20.0000 mg | ORAL_TABLET | Freq: Every day | ORAL | 0 refills | Status: DC

## 2019-04-10 MED ORDER — POTASSIUM CHLORIDE ER 10 MEQ PO TBCR: 10.0000 meq | EXTENDED_RELEASE_TABLET | Freq: Every day | ORAL | 0 refills | Status: DC

## 2019-04-10 NOTE — ED Triage Notes (Signed)
Pt presents to ED via POV with in home care giver who reports that patient has R leg swelling. Care giver reports that patient has had stroke, and is for the most part non-verbal but is able to answer some questions. Caregiver reports day 2 of leg swelling.

## 2019-04-10 NOTE — ED Provider Notes (Signed)
[facility] Emergency Department Provider Note    ____________________________________________   I have reviewed the triage vital signs and the nursing notes.   HISTORY  Chief Complaint Leg Swelling   History limited by and level 5 caveat due to aphasia. History obtained from caretaker.  HPI Belinda Hansen is a 73 y.o. female who presents to the emergency department today because of concerns for right leg swelling.  Caregiver states that he first noticed some swelling 2 days ago.  He denies any preceding trauma.  He states that it has been painful for the patient.  Today is also noticed a small amount of left leg swelling.  Patient states she has had swelling in the past with a negative work-up.  Caregiver states that the patient is on fluid pills.  He is not sure if she has been taking them.  No fevers.  No shortness of breath or chest pain.   Records reviewed. Per medical record review patient has a history of stroke, aphasia.   Past Medical History:  Diagnosis Date  . Aortic aneurysm ([employer])   . Aphasia   . Hypertension   . Stroke ([place])   . Weakness due to acute stroke [facility])    R sided weakness per family    Patient Active Problem List   Diagnosis Date Noted  . Mixed hyperlipidemia 09/14/2018  . 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 Hemiparesis affecting right side as late effect of cerebrovascular accident (CVA) ([place]) 12/23/2017  . Essential hypertension 12/23/2017  . Wheel chair as ambulatory aid 12/23/2017    Past Surgical History:  Procedure Laterality Date  . ABDOMINAL AORTIC ANEURYSM REPAIR      Prior to Admission medications   Medication Sig Start Date End Date Taking? Authorizing Provider  aspirin 81 MG tablet Take 81 mg by mouth daily.    [provider]  atorvastatin (LIPITOR) 40 MG tablet Take 1 tablet (40 mg total) by mouth daily. 05/07/18   Glean Hess, MD  lisinopril-hydrochlorothiazide  (PRINZIDE,ZESTORETIC) 10-12.5 MG tablet Take 1 tablet by mouth daily. 05/07/18   Glean Hess, MD    Allergies Patient has no known allergies.  Family History  Family history unknown: Yes    Social History Social History   Tobacco Use  . Smoking status: Never Smoker  . Smokeless tobacco: Never Used  Substance Use Topics  . Alcohol use: Yes    Frequency: Never    Comment: beer occasionally  . Drug use: No    Review of Systems limited secondary to aphasia. ROS obtained from caregiver.  Constitutional: No fever/chills Cardiovascular: Denies chest pain. Respiratory: Denies shortness of breath. Musculoskeletal: Positive for right leg swelling and pain.  Skin: Negative for rash. ____________________________________________   PHYSICAL EXAM:  VITAL SIGNS: ED Triage Vitals  Enc Vitals Group     BP 04/10/19 1852 124/75     Pulse Rate 04/10/19 1852 93     Resp 04/10/19 1852 (!) 22     Temp --      Temp src --      SpO2 04/10/19 1852 100 %     Weight 04/10/19 1849 165 lb (74.8 kg)   Constitutional: Awake and alert.  Eyes: Conjunctivae are normal.  ENT      Head: Normocephalic and atraumatic.      Nose: No congestion/rhinnorhea.      Mouth/Throat: Mucous membranes are moist.      Neck: No stridor. Hematological/Lymphatic/Immunilogical: No cervical lymphadenopathy.  Cardiovascular: Normal rate, regular rhythm.  No murmurs, rubs, or gallops.  Respiratory: Normal respiratory effort without tachypnea nor retractions. Breath sounds are clear and equal bilaterally. No wheezes/rales/rhonchi. Gastrointestinal: Soft and non tender. No rebound. No guarding.  Genitourinary: Deferred Musculoskeletal: Normal range of motion in all extremities. No lower extremity edema. Neurologic:  Normal speech and language. No gross focal neurologic deficits are appreciated.  Skin:  Skin is warm, dry and intact. No rash noted. Psychiatric: Mood and affect are normal. Speech and behavior are  normal. Patient exhibits appropriate insight and judgment.  ____________________________________________    LABS (pertinent positives/negatives)  CMP wnl except glu 111, alk phos 148 CBC wbc 9.8, hgb 13.1, plt 383 BNP 165.0 ____________________________________________   EKG  None  ____________________________________________    RADIOLOGY  Venous lower extremity right No DVT  ____________________________________________   PROCEDURES  Procedures  ____________________________________________   INITIAL IMPRESSION / ASSESSMENT AND PLAN / ED COURSE  Pertinent labs & imaging results that were available during my care of the patient were reviewed by me and considered in my medical decision making (see chart for details).   Patient presented to the emergency department today because of concerns for right leg swelling and now some left leg swelling.  On exam patient does have swelling to that right lower leg.  There is nontender on my exam.  Differential would include fluid overload, kidney disease, blood clot amongst other etiologies.  Patient's work-up here did show a mild elevation in the BNP but otherwise without concerning findings.  This could be related to slight fluid overload especially patient has not been taking her lisinopril/hydrochlorothiazide.  Will plan on discharging home with Lasix for the next few days. Discussed importance of PCP follow up.   ___________________________________________   FINAL CLINICAL IMPRESSION(S) / ED DIAGNOSES  Final diagnoses:  Peripheral edema  Right leg swelling     Note: This dictation was prepared with Dragon dictation. Any transcriptional errors that result from this process are unintentional     [name], MD 04/10/19 2137

## 2019-04-10 NOTE — ED Notes (Signed)
Pt off the floor to US

## 2019-04-10 NOTE — ED Notes (Signed)
Pt and caregiver verbalized understanding of d/c instructions, RX, and f/u care. No further questions at this time. Pt in personal wheelchair and assisted to exit by caregiver.

## 2019-04-10 NOTE — Discharge Instructions (Addendum)
Please seek medical attention for any high fevers, chest pain, shortness of breath, change in behavior, persistent vomiting, bloody stool or any other new or concerning symptoms.  

## 2019-05-25 ENCOUNTER — Other Ambulatory Visit: Payer: Self-pay | Admitting: Family Medicine

## 2019-05-25 DIAGNOSIS — E785 Hyperlipidemia, unspecified: Secondary | ICD-10-CM

## 2019-05-25 DIAGNOSIS — I10 Essential (primary) hypertension: Secondary | ICD-10-CM

## 2019-05-25 NOTE — Telephone Encounter (Signed)
LM to call back-ah

## 2019-06-28 ENCOUNTER — Other Ambulatory Visit: Payer: Self-pay

## 2019-06-28 MED ORDER — BETAMETHASONE DIPROPIONATE 0.05 % EX CREA: TOPICAL_CREAM | CUTANEOUS | 0 refills | Status: DC

## 2019-07-29 ENCOUNTER — Other Ambulatory Visit: Payer: Self-pay | Admitting: Internal Medicine

## 2019-07-29 DIAGNOSIS — E785 Hyperlipidemia, unspecified: Secondary | ICD-10-CM

## 2019-07-29 DIAGNOSIS — I10 Essential (primary) hypertension: Secondary | ICD-10-CM

## 2019-08-01 NOTE — Telephone Encounter (Signed)
LM to call back.

## 2019-09-13 ENCOUNTER — Ambulatory Visit (INDEPENDENT_AMBULATORY_CARE_PROVIDER_SITE_OTHER): Payer: Medicare Other | Admitting: Internal Medicine

## 2019-09-13 ENCOUNTER — Other Ambulatory Visit: Payer: Self-pay

## 2019-09-13 ENCOUNTER — Encounter: Payer: Self-pay | Admitting: Internal Medicine

## 2019-09-13 VITALS — BP 86/60 | HR 76 | Ht 66.0 in | Wt 160.0 lb

## 2019-09-13 DIAGNOSIS — I10 Essential (primary) hypertension: Secondary | ICD-10-CM

## 2019-09-13 DIAGNOSIS — I69351 Hemiplegia and hemiparesis following cerebral infarction affecting right dominant side: Secondary | ICD-10-CM | POA: Diagnosis not present

## 2019-09-13 NOTE — Progress Notes (Signed)
Date:  09/13/2019   Name:  Belinda Hansen   DOB:  [birth year]   MRN:  [id number]   Chief Complaint: Power Wheel Chair (Patient came in today with her old hoverround chair. Needs new chair.) Pt here for face to face for PWC replacement.   Her current Hoverround can not be repaired any further. She denies pressure sores.  She can shift her weight adequately in the chair.    Hypertension This is a chronic problem. The problem is controlled. Pertinent negatives include no chest pain, headaches, palpitations or shortness of breath. Past treatments include ACE inhibitors and diuretics. The current treatment provides significant improvement.    Review of Systems  Constitutional: Negative for chills, fatigue, fever and unexpected weight change.  Respiratory: Negative for chest tightness and shortness of breath.   Cardiovascular: Negative for chest pain, palpitations and leg swelling.  Gastrointestinal: Negative for abdominal pain.  Musculoskeletal: Positive for arthralgias and gait problem.  Neurological: Positive for weakness. Negative for dizziness, seizures and headaches.  Psychiatric/Behavioral: Negative for sleep disturbance.    Patient Active Problem List   Diagnosis Date Noted  . Mixed hyperlipidemia 09/14/2018  . 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 Hemiparesis affecting right side as late effect of cerebrovascular accident (CVA) ([place]) 12/23/2017  . Essential hypertension 12/23/2017  . Wheel chair as ambulatory aid 12/23/2017    No Known Allergies  Past Surgical History:  Procedure Laterality Date  . ABDOMINAL AORTIC ANEURYSM REPAIR      Social History   Tobacco Use  . Smoking status: Never Smoker  . Smokeless tobacco: Never Used  Substance Use Topics  . Alcohol use: Yes    Frequency: Never    Comment: beer occasionally  . Drug use: No     Medication list has been reviewed and updated.  Current Meds  Medication Sig  . aspirin 81 MG  tablet Take 81 mg by mouth daily.  Marland Kitchen atorvastatin (LIPITOR) 40 MG tablet Take 1 tablet by mouth once daily  . betamethasone dipropionate (DIPROLENE) 0.05 % cream APPLY CREAM TOPICALLY TWICE DAILY AS NEEDED  . furosemide (LASIX) 20 MG tablet Take 1 tablet (20 mg total) by mouth daily for 3 days.  Marland Kitchen lisinopril-hydrochlorothiazide (ZESTORETIC) 10-12.5 MG tablet Take 1 tablet by mouth once daily  . potassium chloride (K-DUR) 10 MEQ tablet Take 1 tablet (10 mEq total) by mouth daily for 3 days.    PHQ 2/9 Scores 09/13/2019 05/07/2018 12/23/2017 12/23/2017  PHQ - 2 Score 0 0 0 0  PHQ- 9 Score 0 - - -  Exception Documentation - - Patient refusal -  Not completed - - patient unable to explain patient unable to answer     BP Readings from Last 3 Encounters:  09/13/19 (!) 86/60  04/10/19 (!) 171/92  12/09/18 (!) 176/96    Physical Exam Constitutional:      Appearance: She is underweight. She is not ill-appearing or toxic-appearing.  Neck:     Musculoskeletal: Normal range of motion.     Vascular: No carotid bruit.  Cardiovascular:     Rate and Rhythm: Normal rate and regular rhythm.     Heart sounds: No murmur.  Pulmonary:     Effort: Pulmonary effort is normal.     Breath sounds: Normal breath sounds and air entry.  Abdominal:     General: Abdomen is flat.     Palpations: Abdomen is soft.     Tenderness: There is no abdominal tenderness.  Musculoskeletal:     Comments: RUE flaccid with atrophy Extensor deformities of right fingers noted. Pt would not allow full examination of her RUE due to concerns of pain  LUE grip 4+/5, deltoid 5/5 LLE quads 4+/5, plantar flexion 4/5  Lymphadenopathy:     Cervical: No cervical adenopathy.  Neurological:     Mental Status: She is alert.     Motor: Atrophy (RUE and RLE) present.     Comments: Dense right hemi-paresis.  Unable to ambulate.   Psychiatric:        Attention and Perception: Attention normal.        Mood and Affect: Mood normal.      Comments: Speech impaired - mild expressive aphasia; no receptive aphasia.  Memory difficult to assess.  Pt observed using the [place] and is able to navigate well, able to open doors in front of her, no irratic movements noted     Wt Readings from Last 3 Encounters:  09/13/19 160 lb (72.6 kg)  04/10/19 165 lb (74.8 kg)  12/09/18 170 lb (77.1 kg)    BP (!) 86/60   Pulse 76   Ht 5\' 6"  (1.676 m)   Wt 160 lb (72.6 kg) Comment: wheelchair  SpO2 98%   BMI 25.82 kg/m   Assessment and Plan: 1. Hemiparesis affecting right side as late effect of cerebrovascular accident (CVA) ([place]) MRADLs are impaired in the home - pt requires PMD to perform toileting, meal preparation and general ambulation. She can not use a cane or walker due to dense right Upper and lower hemiparesis. She can not operate manual wheelchair due to dense RUE paresis. She can not use a scooter due to dense RUE paresis and truncal instability. Pt was able to demonstrate in the office that she can operate a PMD safely. She is very motivated and willing to use the PMD.  2. Essential hypertension Clinically stable exam with well controlled BP.   Tolerating medications without side effects at this time. Pt to continue current regimen and low sodium diet;   Partially dictated using Editor, commissioning. Any errors are unintentional.  Halina Maidens, MD [facility]  09/13/2019

## 2019-10-25 NOTE — Telephone Encounter (Signed)
LM to call back.

## 2019-10-26 ENCOUNTER — Other Ambulatory Visit: Payer: Self-pay | Admitting: Internal Medicine

## 2019-10-26 DIAGNOSIS — I10 Essential (primary) hypertension: Secondary | ICD-10-CM

## 2019-10-26 DIAGNOSIS — E785 Hyperlipidemia, unspecified: Secondary | ICD-10-CM

## 2019-10-27 ENCOUNTER — Other Ambulatory Visit: Payer: Self-pay

## 2019-11-29 ENCOUNTER — Other Ambulatory Visit: Payer: Self-pay

## 2019-11-29 MED ORDER — BETAMETHASONE DIPROPIONATE 0.05 % EX CREA: TOPICAL_CREAM | CUTANEOUS | 3 refills | Status: AC

## 2019-12-27 ENCOUNTER — Other Ambulatory Visit: Payer: Self-pay

## 2019-12-27 ENCOUNTER — Ambulatory Visit
Admission: EM | Admit: 2019-12-27 | Discharge: 2019-12-27 | Disposition: A | Discharge status: Home / Self Care | Payer: Medicare Other | Attending: Family Medicine | Admitting: Family Medicine

## 2019-12-27 ENCOUNTER — Encounter: Payer: Self-pay | Admitting: Emergency Medicine

## 2019-12-27 DIAGNOSIS — M79604 Pain in right leg: Secondary | ICD-10-CM | POA: Diagnosis not present

## 2019-12-27 DIAGNOSIS — M79605 Pain in left leg: Secondary | ICD-10-CM | POA: Diagnosis not present

## 2019-12-27 MED ORDER — TRAMADOL HCL 50 MG PO TABS: 50.0000 mg | ORAL_TABLET | Freq: Two times a day (BID) | ORAL | 0 refills | Status: AC | PRN

## 2019-12-27 NOTE — ED Triage Notes (Signed)
Patient c/o bilateral leg pain that started yesterday. Denies injury.

## 2019-12-27 NOTE — Discharge Instructions (Signed)
Follow up with Primary Care Provider °

## 2019-12-30 NOTE — ED Provider Notes (Signed)
[facility]    CSN: [id number] Arrival date & time: 12/27/19  1827      History   Chief Complaint Chief Complaint  Patient presents with  . Leg Pain    HPI Belinda Hansen is a 74 y.o. female.   74 yo female with a h/o CVA with residual aphasia and right sided hemiparesis presents with caregiver with a c/o legs pains. Caregiver states patient started complaining yesterday, however patient states pain has been longer. Denies any falls or other traumatic injuries. Denies any swelling, redness or rash.    Leg Pain   Past Medical History:  Diagnosis Date  . Aortic aneurysm ([place])   . Aphasia   . Hypertension   . Stroke ([place])   . Weakness due to acute stroke [facility])    R sided weakness per family    Patient Active Problem List   Diagnosis Date Noted  . Mixed hyperlipidemia 09/14/2018  . 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 Hemiparesis affecting right side as late effect of cerebrovascular accident (CVA) ([place]) 12/23/2017  . Essential hypertension 12/23/2017  . Wheel chair as ambulatory aid 12/23/2017    Past Surgical History:  Procedure Laterality Date  . ABDOMINAL AORTIC ANEURYSM REPAIR      OB History   No obstetric history on file.      Home Medications    Prior to Admission medications   Medication Sig Start Date End Date Taking? Authorizing Provider  aspirin 81 MG tablet Take 81 mg by mouth daily.   Yes [provider]  atorvastatin (LIPITOR) 40 MG tablet Take 1 tablet by mouth once daily 10/26/19  Yes Glean Hess, MD  lisinopril-hydrochlorothiazide (ZESTORETIC) 10-12.5 MG tablet Take 1 tablet by mouth once daily 10/26/19  Yes Glean Hess, MD  betamethasone dipropionate 0.05 % cream APPLY CREAM TOPICALLY TWICE DAILY AS NEEDED 11/29/19   Glean Hess, MD  traMADol (ULTRAM) 50 MG tablet Take 1 tablet (50 mg total) by mouth every 12 (twelve) hours as needed. 12/27/19   Norval Gable, MD     Family History Family History  Family history unknown: Yes    Social History Social History   Tobacco Use  . Smoking status: Never Smoker  . Smokeless tobacco: Never Used  Substance Use Topics  . Alcohol use: Yes    Comment: beer occasionally  . Drug use: No     Allergies   Patient has no known allergies.   Review of Systems Review of Systems   Physical Exam Triage Vital Signs ED Triage Vitals  Enc Vitals Group     BP 12/27/19 1927 102/67     Pulse Rate 12/27/19 1927 80     Resp 12/27/19 1927 18     Temp 12/27/19 1927 97.9 F (36.6 C)     Temp Source 12/27/19 1927 Oral     SpO2 12/27/19 1927 100 %     Weight 12/27/19 1925 160 lb (72.6 kg)     Height --      Head Circumference --      Peak Flow --      Pain Score 12/27/19 1925 8     Pain Loc --      Pain Edu? --      Excl. in Ord? --    No data found.  Updated Vital Signs BP 102/67 (BP Location: Right Arm)   Pulse 80   Temp 97.9 F (36.6 C) (Oral)   Resp 18  Wt 72.6 kg   SpO2 100%   BMI 25.82 kg/m   Visual Acuity Right Eye Distance:   Left Eye Distance:   Bilateral Distance:    Right Eye Near:   Left Eye Near:    Bilateral Near:     Physical Exam Vitals and nursing note reviewed.  Constitutional:      General: She is not in acute distress.    Appearance: She is not toxic-appearing or diaphoretic.  Musculoskeletal:     Right lower leg: Tenderness present. No lacerations. No edema.     Left lower leg: Normal. No lacerations or tenderness. No edema.     Comments: Right lower extremity contractures (chronic; secondary to CVA)  Neurological:     Mental Status: She is alert.      UC Treatments / Results  Labs (all labs ordered are listed, but only abnormal results are displayed) Labs Reviewed - No data to display  EKG   Radiology No results found.  Procedures Procedures (including critical care time)  Medications Ordered in UC Medications - No data to display  Initial  Impression / Assessment and Plan / UC Course  I have reviewed the triage vital signs and the nursing notes.  Pertinent labs & imaging results that were available during my care of the patient were reviewed by me and considered in my medical decision making (see chart for details).      Final Clinical Impressions(s) / UC Diagnoses   Final diagnoses:  Pain in both lower extremities     Discharge Instructions     Follow up with Primary Care Provider    ED Prescriptions    Medication Sig Dispense Auth. Provider   traMADol (ULTRAM) 50 MG tablet Take 1 tablet (50 mg total) by mouth every 12 (twelve) hours as needed. 15 tablet Norval Gable, MD      1. diagnosis reviewed with patient and caregiver 2. rx as per orders above; reviewed possible side effects, interactions, risks and benefits  3. Follow up with PCP 4. Follow-up prn if symptoms worsen or don't improve   I have reviewed the PDMP during this encounter.   Norval Gable, MD 12/30/19 1732

## 2020-04-12 ENCOUNTER — Telehealth: Payer: Self-pay | Admitting: Internal Medicine

## 2020-04-12 NOTE — Telephone Encounter (Signed)
[place] social services/Adult protective services has a adult protective services case and they are remotioning for guardianship to have the agency appointed . they needs a completed FL2 form to let them know what level of care the Pt needs / please advise / they need this by next week

## 2020-04-16 NOTE — Telephone Encounter (Signed)
I think we can do an FL-2 without a visit.

## 2020-04-16 NOTE — Telephone Encounter (Signed)
Brooke called back and I advised her of the message about the FL2 and Jerene Pitch asked what type of supervision Dr. Army Melia thinks the Pt needs/ 24/7 or daily check ins or multiple times a day/please advise    Can you please fax FL2 paperwork to [place] once completed to [phone]

## 2020-04-16 NOTE — Telephone Encounter (Signed)
I have no idea what level of supervision she needs. I have only seen her three times since 2019.   [facility] is the one going into the home and should be the ones to determine that.  I thought that they wanted the FL-2 for placement.

## 2020-04-23 ENCOUNTER — Telehealth: Payer: Self-pay | Admitting: Internal Medicine

## 2020-04-23 NOTE — Telephone Encounter (Unsigned)
Copied from [place] [phone]. Topic: General - Inquiry >> Apr 23, 2020  9:50 AM Mathis Bud wrote: Reason for CRM: Brooke from social services, called requesting a call back regarding patients Fl2 forms.  Brooke had some questions for the nurse.  Also Jerene Pitch wanted to speak to nurse regarding patients vaping and alcohol use.   Call back [phone]

## 2020-04-23 NOTE — Telephone Encounter (Signed)
Called and left VM asking brooke what further info she needs. Told her that its noted in pts chart she rarely drinks a beer and she does not vape that we know of.  CM

## 2020-04-24 NOTE — Telephone Encounter (Signed)
Tried to call the callback number for brooke. A female answered and said I had the wrong number?

## 2020-04-24 NOTE — Telephone Encounter (Signed)
Belinda Hansen called back in regards to message from yesterday. Informed of message. Belinda Hansen seeking more information in regards to diagnosis code on FL2 form as she could not find what was in correlation with it. Please advise. Call back is [phone].

## 2020-04-25 NOTE — Telephone Encounter (Signed)
Called and spoke with Jerene Pitch clarifying Fl2 form.   CM

## 2020-04-25 NOTE — Telephone Encounter (Signed)
Belinda Hansen called back stating wrong number had been give. Correct number is [phone]. Please advise.

## 2020-04-25 NOTE — Telephone Encounter (Signed)
See note

## 2020-04-27 ENCOUNTER — Ambulatory Visit (LOCAL_COMMUNITY_HEALTH_CENTER): Payer: Self-pay

## 2020-04-27 DIAGNOSIS — Z111 Encounter for screening for respiratory tuberculosis: Secondary | ICD-10-CM

## 2020-04-30 ENCOUNTER — Ambulatory Visit (LOCAL_COMMUNITY_HEALTH_CENTER): Payer: Medicare Other

## 2020-04-30 DIAGNOSIS — Z111 Encounter for screening for respiratory tuberculosis: Secondary | ICD-10-CM

## 2020-05-01 ENCOUNTER — Other Ambulatory Visit: Payer: Self-pay

## 2020-05-01 LAB — TB SKIN TEST
Induration: 0 mm
TB Skin Test: NEGATIVE

## 2020-05-19 ENCOUNTER — Encounter: Payer: Self-pay | Admitting: Emergency Medicine

## 2020-05-19 ENCOUNTER — Other Ambulatory Visit: Payer: Self-pay

## 2020-05-19 ENCOUNTER — Emergency Department: Payer: Medicare Other

## 2020-05-19 ENCOUNTER — Emergency Department
Admission: EM | Admit: 2020-05-19 | Discharge: 2020-05-19 | Disposition: A | Discharge status: Home / Self Care | Payer: Medicare Other | Attending: Emergency Medicine | Admitting: Emergency Medicine

## 2020-05-19 DIAGNOSIS — L03116 Cellulitis of left lower limb: Secondary | ICD-10-CM | POA: Insufficient documentation

## 2020-05-19 DIAGNOSIS — R52 Pain, unspecified: Secondary | ICD-10-CM | POA: Diagnosis not present

## 2020-05-19 DIAGNOSIS — L0390 Cellulitis, unspecified: Secondary | ICD-10-CM

## 2020-05-19 DIAGNOSIS — M7989 Other specified soft tissue disorders: Secondary | ICD-10-CM

## 2020-05-19 DIAGNOSIS — Z7982 Long term (current) use of aspirin: Secondary | ICD-10-CM | POA: Diagnosis not present

## 2020-05-19 DIAGNOSIS — I10 Essential (primary) hypertension: Secondary | ICD-10-CM | POA: Diagnosis not present

## 2020-05-19 DIAGNOSIS — M79604 Pain in right leg: Secondary | ICD-10-CM

## 2020-05-19 DIAGNOSIS — Z7401 Bed confinement status: Secondary | ICD-10-CM | POA: Diagnosis not present

## 2020-05-19 DIAGNOSIS — L03115 Cellulitis of right lower limb: Secondary | ICD-10-CM | POA: Insufficient documentation

## 2020-05-19 DIAGNOSIS — Z79899 Other long term (current) drug therapy: Secondary | ICD-10-CM | POA: Diagnosis not present

## 2020-05-19 DIAGNOSIS — R410 Disorientation, unspecified: Secondary | ICD-10-CM | POA: Diagnosis not present

## 2020-05-19 DIAGNOSIS — M79605 Pain in left leg: Secondary | ICD-10-CM

## 2020-05-19 DIAGNOSIS — R4781 Slurred speech: Secondary | ICD-10-CM | POA: Diagnosis not present

## 2020-05-19 DIAGNOSIS — R600 Localized edema: Secondary | ICD-10-CM | POA: Diagnosis present

## 2020-05-19 DIAGNOSIS — M2550 Pain in unspecified joint: Secondary | ICD-10-CM | POA: Diagnosis not present

## 2020-05-19 DIAGNOSIS — M25571 Pain in right ankle and joints of right foot: Secondary | ICD-10-CM | POA: Diagnosis not present

## 2020-05-19 DIAGNOSIS — R000 Tachycardia, unspecified: Secondary | ICD-10-CM | POA: Diagnosis not present

## 2020-05-19 LAB — COMPREHENSIVE METABOLIC PANEL
ALT: 21 U/L (ref 0–44)
AST: 26 U/L (ref 15–41)
Albumin: 3.9 g/dL (ref 3.5–5.0)
Alkaline Phosphatase: 142 U/L — ABNORMAL HIGH (ref 38–126)
Anion gap: 11 (ref 5–15)
BUN: 27 mg/dL — ABNORMAL HIGH (ref 8–23)
CO2: 22 mmol/L (ref 22–32)
Calcium: 9.2 mg/dL (ref 8.9–10.3)
Chloride: 106 mmol/L (ref 98–111)
Creatinine, Ser: 1.04 mg/dL — ABNORMAL HIGH (ref 0.44–1.00)
GFR calc Af Amer: >60 mL/min (ref >60)
GFR calc non Af Amer: 53 mL/min — ABNORMAL LOW (ref >60)
Glucose, Bld: 104 mg/dL — ABNORMAL HIGH (ref 70–99)
Potassium: 4 mmol/L (ref 3.5–5.1)
Sodium: 139 mmol/L (ref 135–145)
Total Bilirubin: 0.6 mg/dL (ref 0.3–1.2)
Total Protein: 8 g/dL (ref 6.5–8.1)

## 2020-05-19 LAB — CBC WITH DIFFERENTIAL/PLATELET
Abs Immature Granulocytes: 0.02 10*3/uL (ref 0.00–0.07)
Basophils Absolute: 0 10*3/uL (ref 0.0–0.1)
Basophils Relative: 1 %
Eosinophils Absolute: 0.2 10*3/uL (ref 0.0–0.5)
Eosinophils Relative: 3 %
HCT: 35.8 % — ABNORMAL LOW (ref 36.0–46.0)
Hemoglobin: 11.4 g/dL — ABNORMAL LOW (ref 12.0–15.0)
Immature Granulocytes: 0 %
Lymphocytes Relative: 17 %
Lymphs Abs: 1.1 10*3/uL (ref 0.7–4.0)
MCH: 28.9 pg (ref 26.0–34.0)
MCHC: 31.8 g/dL (ref 30.0–36.0)
MCV: 90.9 fL (ref 80.0–100.0)
Monocytes Absolute: 0.3 10*3/uL (ref 0.1–1.0)
Monocytes Relative: 6 %
Neutro Abs: 4.5 10*3/uL (ref 1.7–7.7)
Neutrophils Relative %: 73 %
Platelets: 281 10*3/uL (ref 150–400)
RBC: 3.94 MIL/uL (ref 3.87–5.11)
RDW: 14.2 % (ref 11.5–15.5)
WBC: 6.2 10*3/uL (ref 4.0–10.5)
nRBC: 0 % (ref 0.0–0.2)

## 2020-05-19 LAB — BRAIN NATRIURETIC PEPTIDE: B Natriuretic Peptide: 37.9 pg/mL (ref 0.0–100.0)

## 2020-05-19 MED ORDER — DOXYCYCLINE HYCLATE 100 MG PO CAPS: 100.0000 mg | ORAL_CAPSULE | Freq: Two times a day (BID) | ORAL | 0 refills | Status: AC

## 2020-05-19 NOTE — ED Notes (Signed)
Patient given warm blanket and tv turned on for patient

## 2020-05-19 NOTE — ED Triage Notes (Signed)
First RN Note: Pt presents to ED via [facility] from [facility]. Per EMS initially dispatched out for chest pain, however upon arrival pt was c/o L leg pain. Per EMS unclear what the actual complaint is.   104/70 122HR

## 2020-05-19 NOTE — ED Triage Notes (Signed)
Pt to ED via [facility] from [facility]. Pt is very poor historian. Per First RN note EMS was called out for chest pain, however, when EMS arrived pt was c/o left leg pain. Pt has swelling in both feet that extends up to her thighs, Pt has redness in both with slothing of the skin Pt has hx/o stroke with expressive aphasia.

## 2020-05-19 NOTE — ED Notes (Signed)
Pt unable to sign discharge paperwork

## 2020-05-19 NOTE — ED Notes (Signed)
Pt states she is in [place]. Pt does not know where she lives but denies pain. Pt in bed, with both side rails up.  Marland Kitchen

## 2020-05-19 NOTE — Discharge Instructions (Addendum)
Please seek medical attention for any high fevers, chest pain, shortness of breath, change in behavior, persistent vomiting, bloody stool or any other new or concerning symptoms.  

## 2020-05-19 NOTE — ED Notes (Signed)
RN attempted to call report to the [place]. No answer.

## 2020-05-19 NOTE — ED Provider Notes (Signed)
[facility] Emergency Department Provider Note   ____________________________________________   I have reviewed the triage vital signs and the nursing notes.   HISTORY  Chief Complaint Leg Pain and Leg Swelling   History limited by and level 5 caveat due to: Expressive aphagia  HPI Belinda Hansen is a 74 y.o. female who presents to the emergency department today via EMS. The patient does have expressive aphagia so history is somewhat limited, although patient is able to answer some questions and yes/no questions. When asked about chest pain the patient shook her head no. When asked about the leg pain the patient said yes. Indicated both legs. Also had swelling to right leg. Says that it has been going on for roughly 1 month. Denies any injuries. Unclear if she has had similar episodes in the past. Patient denies any fevers, chest pain or shortness of breath.   Records reviewed. Per medical record review patient has a history of aphagia, CVA, HTN. History of visit to the ER for leg swelling in the past.   Past Medical History:  Diagnosis Date  . Aortic aneurysm ([place])   . Aphasia   . Hypertension   . Stroke ([place])   . Weakness due to acute stroke [facility])    R sided weakness per family    Patient Active Problem List   Diagnosis Date Noted  . Mixed hyperlipidemia 09/14/2018  . 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 Hemiparesis affecting right side as late effect of cerebrovascular accident (CVA) ([place]) 12/23/2017  . Essential hypertension 12/23/2017  . Wheel chair as ambulatory aid 12/23/2017    Past Surgical History:  Procedure Laterality Date  . ABDOMINAL AORTIC ANEURYSM REPAIR      Prior to Admission medications   Medication Sig Start Date End Date Taking? Authorizing Provider  aspirin 81 MG tablet Take 81 mg by mouth daily.    [provider]  atorvastatin (LIPITOR) 40 MG tablet Take 1 tablet by mouth once daily  10/26/19   Glean Hess, MD  betamethasone dipropionate 0.05 % cream APPLY CREAM TOPICALLY TWICE DAILY AS NEEDED 11/29/19   Glean Hess, MD  lisinopril-hydrochlorothiazide (ZESTORETIC) 10-12.5 MG tablet Take 1 tablet by mouth once daily 10/26/19   Glean Hess, MD  traMADol (ULTRAM) 50 MG tablet Take 1 tablet (50 mg total) by mouth every 12 (twelve) hours as needed. 12/27/19   Norval Gable, MD    Allergies Patient has no known allergies.  Family History  Family history unknown: Yes    Social History Social History   Tobacco Use  . Smoking status: Never Smoker  . Smokeless tobacco: Never Used  Substance Use Topics  . Alcohol use: Yes    Comment: beer occasionally  . Drug use: No    Review of Systems limited secondary to aphagia Constitutional: No fever/chills Cardiovascular: Denies chest pain. Respiratory: Denies shortness of breath. Gastrointestinal: No abdominal pain.   Musculoskeletal: Positive for leg pain, swelling to right leg Neurological: Negative for focal weakness or numbness.  ____________________________________________   PHYSICAL EXAM:  VITAL SIGNS: ED Triage Vitals [05/19/20 1636]  Enc Vitals Group     BP 100/75     Pulse Rate (!) 105     Resp 16     Temp 98.3 F (36.8 C)     Temp Source Oral     SpO2 100 %    Constitutional: Alert and oriented.  Eyes: Conjunctivae are normal.  ENT  Head: Normocephalic and atraumatic.      Nose: No congestion/rhinnorhea.      Mouth/Throat: Mucous membranes are moist.      Neck: No stridor. Hematological/Lymphatic/Immunilogical: No cervical lymphadenopathy. Cardiovascular: Normal rate, regular rhythm.  No murmurs, rubs, or gallops.  Respiratory: Normal respiratory effort without tachypnea nor retractions. Breath sounds are clear and equal bilaterally. No wheezes/rales/rhonchi. Gastrointestinal: Soft and non tender. No rebound. No guarding.  Genitourinary: Deferred Musculoskeletal: Right  upper extremity held in contracture. Right lower extremity with edema. Bilateral lower legs tender to palpation. Neurologic:  Aphagia, holds right upper extremity in contracture Skin:  Skin is warm, dry and intact. No rash noted.  ____________________________________________    LABS (pertinent positives/negatives)  BNP 37.9 CMP na 139, k 4.0, cr 1.04, alk phos 142, alb 3.9 CBC wbc 6.2, hgb 11.4, plt 281  ____________________________________________   EKG  None  ____________________________________________    RADIOLOGY  US venous bilateral No DVT  ____________________________________________   PROCEDURES  Procedures  ____________________________________________   INITIAL IMPRESSION / ASSESSMENT AND PLAN / ED COURSE  Pertinent labs & imaging results that were available during my care of the patient were reviewed by me and considered in my medical decision making (see chart for details).   Patient presented to the emergency department today because of concerns for leg pain and swelling.  On exam patient has noticeable swelling to the right lower leg.  I did obtain a bilateral ultrasound which was negative for DVT.  Blood work without any elevation of BNP or concerning signs for liver failure or kidney failure.  At this point do wonder if patient has cellulitis given pain and swelling.  Will trial patient on antibiotics.  Discussed findings and plan with patient.  ___________________________________________   FINAL CLINICAL IMPRESSION(S) / ED DIAGNOSES  Final diagnoses:  Leg swelling  Pain in both lower extremities  Cellulitis, unspecified cellulitis site     Note: This dictation was prepared with Dragon dictation. Any transcriptional errors that result from this process are unintentional     [name], MD 05/19/20 1920

## 2020-06-01 ENCOUNTER — Emergency Department
Admission: EM | Admit: 2020-06-01 | Discharge: 2020-06-01 | Disposition: A | Discharge status: Home / Self Care | Payer: Medicare Other | Attending: Emergency Medicine | Admitting: Emergency Medicine

## 2020-06-01 ENCOUNTER — Other Ambulatory Visit: Payer: Self-pay

## 2020-06-01 ENCOUNTER — Emergency Department: Payer: Medicare Other

## 2020-06-01 ENCOUNTER — Encounter: Payer: Self-pay | Admitting: Emergency Medicine

## 2020-06-01 ENCOUNTER — Telehealth: Payer: Self-pay | Admitting: Internal Medicine

## 2020-06-01 DIAGNOSIS — M25511 Pain in right shoulder: Secondary | ICD-10-CM | POA: Insufficient documentation

## 2020-06-01 DIAGNOSIS — I10 Essential (primary) hypertension: Secondary | ICD-10-CM | POA: Diagnosis not present

## 2020-06-01 DIAGNOSIS — Z79899 Other long term (current) drug therapy: Secondary | ICD-10-CM | POA: Diagnosis not present

## 2020-06-01 DIAGNOSIS — Y9384 Activity, sleeping: Secondary | ICD-10-CM | POA: Diagnosis not present

## 2020-06-01 DIAGNOSIS — W19XXXA Unspecified fall, initial encounter: Secondary | ICD-10-CM

## 2020-06-01 DIAGNOSIS — Z7982 Long term (current) use of aspirin: Secondary | ICD-10-CM | POA: Insufficient documentation

## 2020-06-01 DIAGNOSIS — Y999 Unspecified external cause status: Secondary | ICD-10-CM | POA: Diagnosis not present

## 2020-06-01 DIAGNOSIS — W06XXXA Fall from bed, initial encounter: Secondary | ICD-10-CM | POA: Insufficient documentation

## 2020-06-01 DIAGNOSIS — M79604 Pain in right leg: Secondary | ICD-10-CM | POA: Diagnosis not present

## 2020-06-01 DIAGNOSIS — Y92122 Bedroom in nursing home as the place of occurrence of the external cause: Secondary | ICD-10-CM | POA: Insufficient documentation

## 2020-06-01 DIAGNOSIS — Z8673 Personal history of transient ischemic attack (TIA), and cerebral infarction without residual deficits: Secondary | ICD-10-CM | POA: Diagnosis not present

## 2020-06-01 NOTE — Telephone Encounter (Signed)
Noted.   CM

## 2020-06-01 NOTE — ED Notes (Addendum)
Presents s/p fall  Per EMS she rolled from her bed  Presents from the [facility]  Denies any pain but is rubbing her right upper chest and shoulder area

## 2020-06-01 NOTE — ED Notes (Signed)
Pt was placed on the bed  She is unable to bear wt    Pt is also in 2 wet adult diapers which were soaked thur   Lower legs were weeping and the her socks were stuck

## 2020-06-01 NOTE — Telephone Encounter (Unsigned)
Copied from [place] [phone]. Topic: General - Inquiry >> Jun 01, 2020  7:16 AM Lennox Solders wrote: Reason for CRM: Cire brown is calling from the [facility] to let dr berglund know that the patient has be transported to [facility] this morning around 5 am. Pt fell out of her bed

## 2020-06-01 NOTE — Discharge Instructions (Addendum)
No acute findings on x-ray of the right shoulder and right tib-fib.  It was noticed that the patient presented with poor hygienic care.

## 2020-06-01 NOTE — ED Notes (Signed)
Report called to [facility]

## 2020-06-01 NOTE — ED Provider Notes (Signed)
[facility] Emergency Department Provider Note   ____________________________________________   First MD Initiated Contact with Patient 06/01/20 [phone]     (approximate)  I have reviewed the triage vital signs and the nursing notes.   HISTORY  Chief Complaint Fall    HPI Belinda Hansen is a 74 y.o. female patient complain right shoulder and right lower leg pain secondary to falling out of bed this morning. Patient denies LOC or head injury. Fall was unwitnessed. Complaint is complicated by hemiparesis of the right side secondary to CVA. Pain scale and quality of pain unattainable secondary to aphasia.  When removing clothing for examination found the patient have poor hygienic care.  Patient is wearing 2 disposable underwear.         Past Medical History:  Diagnosis Date  . Aortic aneurysm ([place])   . Aphasia   . Hypertension   . Stroke ([place])   . Weakness due to acute stroke [facility])    R sided weakness per family    Patient Active Problem List   Diagnosis Date Noted  . Mixed hyperlipidemia 09/14/2018  . 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 Hemiparesis affecting right side as late effect of cerebrovascular accident (CVA) ([place]) 12/23/2017  . Essential hypertension 12/23/2017  . Wheel chair as ambulatory aid 12/23/2017    Past Surgical History:  Procedure Laterality Date  . ABDOMINAL AORTIC ANEURYSM REPAIR      Prior to Admission medications   Medication Sig Start Date End Date Taking? Authorizing Provider  aspirin 81 MG tablet Take 81 mg by mouth daily.    [provider]  atorvastatin (LIPITOR) 40 MG tablet Take 1 tablet by mouth once daily 10/26/19   Glean Hess, MD  betamethasone dipropionate 0.05 % cream APPLY CREAM TOPICALLY TWICE DAILY AS NEEDED 11/29/19   Glean Hess, MD  lisinopril-hydrochlorothiazide (ZESTORETIC) 10-12.5 MG tablet Take 1 tablet by mouth once daily 10/26/19   Glean Hess, MD  traMADol (ULTRAM) 50 MG tablet Take 1 tablet (50 mg total) by mouth every 12 (twelve) hours as needed. 12/27/19   Norval Gable, MD    Allergies Patient has no known allergies.  Family History  Family history unknown: Yes    Social History Social History   Tobacco Use  . Smoking status: Never Smoker  . Smokeless tobacco: Never Used  Vaping Use  . Vaping Use: Some days  Substance Use Topics  . Alcohol use: Yes    Comment: beer occasionally  . Drug use: No    Review of Systems  Constitutional: No fever/chills.  Poor hygiene. Eyes: No visual changes. ENT: No sore throat. Cardiovascular: Denies chest pain. Respiratory: Denies shortness of breath. Gastrointestinal: No abdominal pain.  No nausea, no vomiting.  No diarrhea.  No constipation. Genitourinary: Negative for dysuria. Musculoskeletal: Negative for back pain. Skin: Negative for rash. Neurological: Negative for headaches, right hemiparesis   Endocrine:  Hypertension hyperlipidemia.  ____________________________________________   PHYSICAL EXAM:  VITAL SIGNS: ED Triage Vitals  Enc Vitals Group     BP 06/01/20 0610 109/87     Pulse Rate 06/01/20 0610 (!) 103     Resp 06/01/20 0610 18     Temp 06/01/20 0714 98 F (36.7 C)     Temp Source 06/01/20 0714 Oral     SpO2 06/01/20 0610 100 %     Weight --      Height --      Head Circumference --  Peak Flow --      Pain Score 06/01/20 0611 0     Pain Loc --      Pain Edu? --      Excl. in [place]? --     Constitutional: Alert. Well appearing and in no acute distress. Eyes: Conjunctivae are normal. PERRL. EOMI. Head: Atraumatic. Neck: No cervical spine tenderness to palpation. Hematological/Lymphatic/Immunilogical: No cervical lymphadenopathy. Cardiovascular: Normal rate, regular rhythm. Grossly normal heart sounds.  Good peripheral circulation. Respiratory: Normal respiratory effort.  No retractions. Lungs CTAB. Gastrointestinal: Soft and  nontender. No distention. No abdominal bruits. No CVA tenderness. Genitourinary: Deferred Musculoskeletal: Moderate guarding palpation right midshaft humerus and right tib-fib.   Neurologic:  Normal speech and language. No gross focal neurologic deficits are appreciated. No gait instability. Skin:  Skin is warm, dry and intact. No rash noted. Psychiatric: Mood and affect are normal. Speech and behavior are normal.  ____________________________________________   LABS (all labs ordered are listed, but only abnormal results are displayed)  Labs Reviewed - No data to display ____________________________________________  EKG   ____________________________________________  RADIOLOGY  ED MD interpretation:    Official radiology report(s): DG Shoulder Right  Result Date: 06/01/2020 CLINICAL DATA:  Pain secondary to fall. EXAM: RIGHT SHOULDER - 2+ VIEW COMPARISON:  No recent prior. FINDINGS: Diffuse osteopenia. Acromioclavicular and glenohumeral degenerative change. No evidence of fracture, dislocation, or separation. Peripheral vascular calcification. IMPRESSION: 1. Diffuse osteopenia. Acromioclavicular glenohumeral degenerative change. No acute abnormality identified. 2.  Peripheral vascular disease. Electronically Signed   By: Marcello Moores  Register   On: 06/01/2020 08:44   DG Tibia/Fibula Right  Result Date: 06/01/2020 CLINICAL DATA:  Pain after fall. EXAM: RIGHT TIBIA AND FIBULA - 2 VIEW COMPARISON:  None. FINDINGS: Diffuse osteopenia limits evaluation. Mild irregularity of the distal fibula is favored to be position on but a subtle distal fibular fracture is not completely excluded. No other evidence of fracture. IMPRESSION: 1. Diffuse osteopenia limits evaluation. 2. Mild irregularity of the distal fibula may be positional in nature. A subtle distal fibular fracture is not excluded. Dedicated images of the ankle could better evaluate if there is clinical concern in this region. Electronically  Signed   By: Dorise Bullion III M.D   On: 06/01/2020 08:45    ____________________________________________   PROCEDURES  Procedure(s) performed (including Critical Care):  Procedures   ____________________________________________   INITIAL IMPRESSION / ASSESSMENT AND PLAN / ED COURSE  As part of my medical decision making, I reviewed the following data within the [place]     Patient presents with right shoulder and right leg pain secondary to fall.  Discussed negative x-ray findings with patient.  Patient was given discharge care instruction and released back to the nursing home.  Continue previous medications.    Belinda Hansen was evaluated in Emergency Department on 06/01/2020 for the symptoms described in the history of present illness. She was evaluated in the context of the global COVID-19 pandemic, which necessitated consideration that the patient might be at risk for infection with the SARS-CoV-2 virus that causes COVID-19. Institutional protocols and algorithms that pertain to the evaluation of patients at risk for COVID-19 are in a state of rapid change based on information released by regulatory bodies including the CDC and federal and state organizations. These policies and algorithms were followed during the patient's care in the ED.       ____________________________________________   FINAL CLINICAL IMPRESSION(S) / ED DIAGNOSES  Final diagnoses:  Fall, initial encounter  ED Discharge Orders    None       Note:  This document was prepared using Dragon voice recognition software and may include unintentional dictation errors.    Sable Feil, PA-C 06/01/20 0930    Blake Divine, MD 06/02/20 1016

## 2020-06-01 NOTE — ED Triage Notes (Addendum)
Pt to triage via w/c with no distress noted; EMS brought pt in from the [facility], staff st pt rolled OOB; pt denies any c/o

## 2020-08-07 DIAGNOSIS — L309 Dermatitis, unspecified: Secondary | ICD-10-CM | POA: Diagnosis not present

## 2020-08-07 DIAGNOSIS — I10 Essential (primary) hypertension: Secondary | ICD-10-CM | POA: Diagnosis not present

## 2020-08-07 DIAGNOSIS — E785 Hyperlipidemia, unspecified: Secondary | ICD-10-CM | POA: Diagnosis not present

## 2020-08-07 DIAGNOSIS — R269 Unspecified abnormalities of gait and mobility: Secondary | ICD-10-CM | POA: Diagnosis not present

## 2020-08-07 DIAGNOSIS — I6990 Unspecified sequelae of unspecified cerebrovascular disease: Secondary | ICD-10-CM | POA: Diagnosis not present

## 2020-08-07 DIAGNOSIS — R4701 Aphasia: Secondary | ICD-10-CM | POA: Diagnosis not present

## 2020-08-11 DIAGNOSIS — E785 Hyperlipidemia, unspecified: Secondary | ICD-10-CM | POA: Diagnosis not present

## 2020-08-11 DIAGNOSIS — I10 Essential (primary) hypertension: Secondary | ICD-10-CM | POA: Diagnosis not present

## 2020-09-07 DIAGNOSIS — E785 Hyperlipidemia, unspecified: Secondary | ICD-10-CM | POA: Diagnosis not present

## 2020-09-07 DIAGNOSIS — I10 Essential (primary) hypertension: Secondary | ICD-10-CM | POA: Diagnosis not present

## 2020-09-11 DIAGNOSIS — I10 Essential (primary) hypertension: Secondary | ICD-10-CM | POA: Diagnosis not present

## 2020-09-11 DIAGNOSIS — R4701 Aphasia: Secondary | ICD-10-CM | POA: Diagnosis not present

## 2020-09-11 DIAGNOSIS — R269 Unspecified abnormalities of gait and mobility: Secondary | ICD-10-CM | POA: Diagnosis not present

## 2020-09-11 DIAGNOSIS — E785 Hyperlipidemia, unspecified: Secondary | ICD-10-CM | POA: Diagnosis not present

## 2020-09-11 DIAGNOSIS — I6990 Unspecified sequelae of unspecified cerebrovascular disease: Secondary | ICD-10-CM | POA: Diagnosis not present

## 2020-09-14 DIAGNOSIS — N390 Urinary tract infection, site not specified: Secondary | ICD-10-CM | POA: Diagnosis not present

## 2020-09-19 DIAGNOSIS — Z20828 Contact with and (suspected) exposure to other viral communicable diseases: Secondary | ICD-10-CM | POA: Diagnosis not present

## 2020-09-21 DIAGNOSIS — E119 Type 2 diabetes mellitus without complications: Secondary | ICD-10-CM | POA: Diagnosis not present

## 2020-09-21 DIAGNOSIS — E785 Hyperlipidemia, unspecified: Secondary | ICD-10-CM | POA: Diagnosis not present

## 2020-09-21 DIAGNOSIS — D518 Other vitamin B12 deficiency anemias: Secondary | ICD-10-CM | POA: Diagnosis not present

## 2020-09-21 DIAGNOSIS — I10 Essential (primary) hypertension: Secondary | ICD-10-CM | POA: Diagnosis not present

## 2020-09-21 DIAGNOSIS — E038 Other specified hypothyroidism: Secondary | ICD-10-CM | POA: Diagnosis not present

## 2020-09-21 DIAGNOSIS — E559 Vitamin D deficiency, unspecified: Secondary | ICD-10-CM | POA: Diagnosis not present

## 2020-09-26 DIAGNOSIS — Z20828 Contact with and (suspected) exposure to other viral communicable diseases: Secondary | ICD-10-CM | POA: Diagnosis not present

## 2020-09-27 DIAGNOSIS — R011 Cardiac murmur, unspecified: Secondary | ICD-10-CM | POA: Diagnosis not present

## 2020-09-27 DIAGNOSIS — N390 Urinary tract infection, site not specified: Secondary | ICD-10-CM | POA: Diagnosis not present

## 2020-09-27 DIAGNOSIS — R296 Repeated falls: Secondary | ICD-10-CM | POA: Diagnosis not present

## 2020-09-27 DIAGNOSIS — I69351 Hemiplegia and hemiparesis following cerebral infarction affecting right dominant side: Secondary | ICD-10-CM | POA: Diagnosis not present

## 2020-09-27 DIAGNOSIS — I69320 Aphasia following cerebral infarction: Secondary | ICD-10-CM | POA: Diagnosis not present

## 2020-09-27 DIAGNOSIS — I10 Essential (primary) hypertension: Secondary | ICD-10-CM | POA: Diagnosis not present

## 2020-09-28 DIAGNOSIS — I69351 Hemiplegia and hemiparesis following cerebral infarction affecting right dominant side: Secondary | ICD-10-CM | POA: Diagnosis not present

## 2020-09-28 DIAGNOSIS — N390 Urinary tract infection, site not specified: Secondary | ICD-10-CM | POA: Diagnosis not present

## 2020-09-28 DIAGNOSIS — I10 Essential (primary) hypertension: Secondary | ICD-10-CM | POA: Diagnosis not present

## 2020-09-28 DIAGNOSIS — R296 Repeated falls: Secondary | ICD-10-CM | POA: Diagnosis not present

## 2020-09-28 DIAGNOSIS — I69320 Aphasia following cerebral infarction: Secondary | ICD-10-CM | POA: Diagnosis not present

## 2020-10-01 DIAGNOSIS — I10 Essential (primary) hypertension: Secondary | ICD-10-CM | POA: Diagnosis not present

## 2020-10-01 DIAGNOSIS — D492 Neoplasm of unspecified behavior of bone, soft tissue, and skin: Secondary | ICD-10-CM | POA: Diagnosis not present

## 2020-10-01 DIAGNOSIS — I69351 Hemiplegia and hemiparesis following cerebral infarction affecting right dominant side: Secondary | ICD-10-CM | POA: Diagnosis not present

## 2020-10-01 DIAGNOSIS — R229 Localized swelling, mass and lump, unspecified: Secondary | ICD-10-CM | POA: Diagnosis not present

## 2020-10-01 DIAGNOSIS — N390 Urinary tract infection, site not specified: Secondary | ICD-10-CM | POA: Diagnosis not present

## 2020-10-01 DIAGNOSIS — R590 Localized enlarged lymph nodes: Secondary | ICD-10-CM | POA: Diagnosis not present

## 2020-10-01 DIAGNOSIS — R296 Repeated falls: Secondary | ICD-10-CM | POA: Diagnosis not present

## 2020-10-01 DIAGNOSIS — R0989 Other specified symptoms and signs involving the circulatory and respiratory systems: Secondary | ICD-10-CM | POA: Diagnosis not present

## 2020-10-01 DIAGNOSIS — I69320 Aphasia following cerebral infarction: Secondary | ICD-10-CM | POA: Diagnosis not present

## 2020-10-01 DIAGNOSIS — I6523 Occlusion and stenosis of bilateral carotid arteries: Secondary | ICD-10-CM | POA: Diagnosis not present

## 2020-10-02 DIAGNOSIS — I714 Abdominal aortic aneurysm, without rupture: Secondary | ICD-10-CM | POA: Diagnosis not present

## 2020-10-02 DIAGNOSIS — E042 Nontoxic multinodular goiter: Secondary | ICD-10-CM | POA: Diagnosis not present

## 2020-10-02 DIAGNOSIS — R221 Localized swelling, mass and lump, neck: Secondary | ICD-10-CM | POA: Diagnosis not present

## 2020-10-02 DIAGNOSIS — I700 Atherosclerosis of aorta: Secondary | ICD-10-CM | POA: Diagnosis not present

## 2020-10-03 DIAGNOSIS — Z20828 Contact with and (suspected) exposure to other viral communicable diseases: Secondary | ICD-10-CM | POA: Diagnosis not present

## 2020-10-03 DIAGNOSIS — I69351 Hemiplegia and hemiparesis following cerebral infarction affecting right dominant side: Secondary | ICD-10-CM | POA: Diagnosis not present

## 2020-10-03 DIAGNOSIS — I69320 Aphasia following cerebral infarction: Secondary | ICD-10-CM | POA: Diagnosis not present

## 2020-10-03 DIAGNOSIS — I10 Essential (primary) hypertension: Secondary | ICD-10-CM | POA: Diagnosis not present

## 2020-10-03 DIAGNOSIS — R296 Repeated falls: Secondary | ICD-10-CM | POA: Diagnosis not present

## 2020-10-03 DIAGNOSIS — N390 Urinary tract infection, site not specified: Secondary | ICD-10-CM | POA: Diagnosis not present

## 2020-10-04 DIAGNOSIS — I70219 Atherosclerosis of native arteries of extremities with intermittent claudication, unspecified extremity: Secondary | ICD-10-CM | POA: Diagnosis not present

## 2020-10-04 DIAGNOSIS — I70229 Atherosclerosis of native arteries of extremities with rest pain, unspecified extremity: Secondary | ICD-10-CM | POA: Diagnosis not present

## 2020-10-04 DIAGNOSIS — I739 Peripheral vascular disease, unspecified: Secondary | ICD-10-CM | POA: Diagnosis not present

## 2020-10-08 DIAGNOSIS — I69320 Aphasia following cerebral infarction: Secondary | ICD-10-CM | POA: Diagnosis not present

## 2020-10-08 DIAGNOSIS — R296 Repeated falls: Secondary | ICD-10-CM | POA: Diagnosis not present

## 2020-10-08 DIAGNOSIS — N390 Urinary tract infection, site not specified: Secondary | ICD-10-CM | POA: Diagnosis not present

## 2020-10-08 DIAGNOSIS — I69351 Hemiplegia and hemiparesis following cerebral infarction affecting right dominant side: Secondary | ICD-10-CM | POA: Diagnosis not present

## 2020-10-08 DIAGNOSIS — I10 Essential (primary) hypertension: Secondary | ICD-10-CM | POA: Diagnosis not present

## 2020-10-10 DIAGNOSIS — Z23 Encounter for immunization: Secondary | ICD-10-CM | POA: Diagnosis not present

## 2020-10-10 DIAGNOSIS — I10 Essential (primary) hypertension: Secondary | ICD-10-CM | POA: Diagnosis not present

## 2020-10-10 DIAGNOSIS — I69351 Hemiplegia and hemiparesis following cerebral infarction affecting right dominant side: Secondary | ICD-10-CM | POA: Diagnosis not present

## 2020-10-10 DIAGNOSIS — Z20828 Contact with and (suspected) exposure to other viral communicable diseases: Secondary | ICD-10-CM | POA: Diagnosis not present

## 2020-10-10 DIAGNOSIS — R296 Repeated falls: Secondary | ICD-10-CM | POA: Diagnosis not present

## 2020-10-10 DIAGNOSIS — N390 Urinary tract infection, site not specified: Secondary | ICD-10-CM | POA: Diagnosis not present

## 2020-10-10 DIAGNOSIS — I69320 Aphasia following cerebral infarction: Secondary | ICD-10-CM | POA: Diagnosis not present

## 2020-10-11 DIAGNOSIS — I6990 Unspecified sequelae of unspecified cerebrovascular disease: Secondary | ICD-10-CM | POA: Diagnosis not present

## 2020-10-11 DIAGNOSIS — R4701 Aphasia: Secondary | ICD-10-CM | POA: Diagnosis not present

## 2020-10-11 DIAGNOSIS — I10 Essential (primary) hypertension: Secondary | ICD-10-CM | POA: Diagnosis not present

## 2020-10-11 DIAGNOSIS — L209 Atopic dermatitis, unspecified: Secondary | ICD-10-CM | POA: Diagnosis not present

## 2020-10-15 DIAGNOSIS — I69351 Hemiplegia and hemiparesis following cerebral infarction affecting right dominant side: Secondary | ICD-10-CM | POA: Diagnosis not present

## 2020-10-15 DIAGNOSIS — N390 Urinary tract infection, site not specified: Secondary | ICD-10-CM | POA: Diagnosis not present

## 2020-10-15 DIAGNOSIS — I10 Essential (primary) hypertension: Secondary | ICD-10-CM | POA: Diagnosis not present

## 2020-10-15 DIAGNOSIS — I69320 Aphasia following cerebral infarction: Secondary | ICD-10-CM | POA: Diagnosis not present

## 2020-10-15 DIAGNOSIS — R296 Repeated falls: Secondary | ICD-10-CM | POA: Diagnosis not present

## 2020-10-17 DIAGNOSIS — I10 Essential (primary) hypertension: Secondary | ICD-10-CM | POA: Diagnosis not present

## 2020-10-17 DIAGNOSIS — R296 Repeated falls: Secondary | ICD-10-CM | POA: Diagnosis not present

## 2020-10-17 DIAGNOSIS — I69351 Hemiplegia and hemiparesis following cerebral infarction affecting right dominant side: Secondary | ICD-10-CM | POA: Diagnosis not present

## 2020-10-17 DIAGNOSIS — Z20828 Contact with and (suspected) exposure to other viral communicable diseases: Secondary | ICD-10-CM | POA: Diagnosis not present

## 2020-10-17 DIAGNOSIS — N390 Urinary tract infection, site not specified: Secondary | ICD-10-CM | POA: Diagnosis not present

## 2020-10-17 DIAGNOSIS — I69320 Aphasia following cerebral infarction: Secondary | ICD-10-CM | POA: Diagnosis not present

## 2020-10-18 DIAGNOSIS — E119 Type 2 diabetes mellitus without complications: Secondary | ICD-10-CM | POA: Diagnosis not present

## 2020-10-18 DIAGNOSIS — E038 Other specified hypothyroidism: Secondary | ICD-10-CM | POA: Diagnosis not present

## 2020-10-18 DIAGNOSIS — Z79899 Other long term (current) drug therapy: Secondary | ICD-10-CM | POA: Diagnosis not present

## 2020-10-18 DIAGNOSIS — E559 Vitamin D deficiency, unspecified: Secondary | ICD-10-CM | POA: Diagnosis not present

## 2020-10-18 DIAGNOSIS — E7849 Other hyperlipidemia: Secondary | ICD-10-CM | POA: Diagnosis not present

## 2020-10-18 DIAGNOSIS — D518 Other vitamin B12 deficiency anemias: Secondary | ICD-10-CM | POA: Diagnosis not present

## 2020-10-22 DIAGNOSIS — I10 Essential (primary) hypertension: Secondary | ICD-10-CM | POA: Diagnosis not present

## 2020-10-22 DIAGNOSIS — R296 Repeated falls: Secondary | ICD-10-CM | POA: Diagnosis not present

## 2020-10-22 DIAGNOSIS — I69351 Hemiplegia and hemiparesis following cerebral infarction affecting right dominant side: Secondary | ICD-10-CM | POA: Diagnosis not present

## 2020-10-22 DIAGNOSIS — N390 Urinary tract infection, site not specified: Secondary | ICD-10-CM | POA: Diagnosis not present

## 2020-10-22 DIAGNOSIS — I69320 Aphasia following cerebral infarction: Secondary | ICD-10-CM | POA: Diagnosis not present

## 2020-10-24 DIAGNOSIS — Z20828 Contact with and (suspected) exposure to other viral communicable diseases: Secondary | ICD-10-CM | POA: Diagnosis not present

## 2020-10-25 DIAGNOSIS — E785 Hyperlipidemia, unspecified: Secondary | ICD-10-CM | POA: Diagnosis not present

## 2020-10-25 DIAGNOSIS — I69351 Hemiplegia and hemiparesis following cerebral infarction affecting right dominant side: Secondary | ICD-10-CM | POA: Diagnosis not present

## 2020-10-25 DIAGNOSIS — D518 Other vitamin B12 deficiency anemias: Secondary | ICD-10-CM | POA: Diagnosis not present

## 2020-10-25 DIAGNOSIS — I10 Essential (primary) hypertension: Secondary | ICD-10-CM | POA: Diagnosis not present

## 2020-10-25 DIAGNOSIS — E559 Vitamin D deficiency, unspecified: Secondary | ICD-10-CM | POA: Diagnosis not present

## 2020-10-25 DIAGNOSIS — R296 Repeated falls: Secondary | ICD-10-CM | POA: Diagnosis not present

## 2020-10-25 DIAGNOSIS — I69320 Aphasia following cerebral infarction: Secondary | ICD-10-CM | POA: Diagnosis not present

## 2020-10-25 DIAGNOSIS — E119 Type 2 diabetes mellitus without complications: Secondary | ICD-10-CM | POA: Diagnosis not present

## 2020-10-25 DIAGNOSIS — N390 Urinary tract infection, site not specified: Secondary | ICD-10-CM | POA: Diagnosis not present

## 2020-10-25 DIAGNOSIS — E038 Other specified hypothyroidism: Secondary | ICD-10-CM | POA: Diagnosis not present

## 2020-10-27 DIAGNOSIS — N390 Urinary tract infection, site not specified: Secondary | ICD-10-CM | POA: Diagnosis not present

## 2020-10-27 DIAGNOSIS — I10 Essential (primary) hypertension: Secondary | ICD-10-CM | POA: Diagnosis not present

## 2020-10-27 DIAGNOSIS — R296 Repeated falls: Secondary | ICD-10-CM | POA: Diagnosis not present

## 2020-10-27 DIAGNOSIS — I69351 Hemiplegia and hemiparesis following cerebral infarction affecting right dominant side: Secondary | ICD-10-CM | POA: Diagnosis not present

## 2020-10-27 DIAGNOSIS — I69320 Aphasia following cerebral infarction: Secondary | ICD-10-CM | POA: Diagnosis not present

## 2020-10-29 DIAGNOSIS — I69320 Aphasia following cerebral infarction: Secondary | ICD-10-CM | POA: Diagnosis not present

## 2020-10-29 DIAGNOSIS — I69351 Hemiplegia and hemiparesis following cerebral infarction affecting right dominant side: Secondary | ICD-10-CM | POA: Diagnosis not present

## 2020-10-29 DIAGNOSIS — I10 Essential (primary) hypertension: Secondary | ICD-10-CM | POA: Diagnosis not present

## 2020-10-29 DIAGNOSIS — R296 Repeated falls: Secondary | ICD-10-CM | POA: Diagnosis not present

## 2020-10-29 DIAGNOSIS — N390 Urinary tract infection, site not specified: Secondary | ICD-10-CM | POA: Diagnosis not present

## 2020-10-30 DIAGNOSIS — R296 Repeated falls: Secondary | ICD-10-CM | POA: Diagnosis not present

## 2020-10-30 DIAGNOSIS — N390 Urinary tract infection, site not specified: Secondary | ICD-10-CM | POA: Diagnosis not present

## 2020-10-30 DIAGNOSIS — I69351 Hemiplegia and hemiparesis following cerebral infarction affecting right dominant side: Secondary | ICD-10-CM | POA: Diagnosis not present

## 2020-10-30 DIAGNOSIS — I10 Essential (primary) hypertension: Secondary | ICD-10-CM | POA: Diagnosis not present

## 2020-10-30 DIAGNOSIS — I69320 Aphasia following cerebral infarction: Secondary | ICD-10-CM | POA: Diagnosis not present

## 2020-10-31 DIAGNOSIS — Z20828 Contact with and (suspected) exposure to other viral communicable diseases: Secondary | ICD-10-CM | POA: Diagnosis not present

## 2020-11-03 ENCOUNTER — Emergency Department
Admission: EM | Admit: 2020-11-03 | Discharge: 2020-11-03 | Disposition: A | Discharge status: Home / Self Care | Payer: Medicare Other | Attending: Emergency Medicine | Admitting: Emergency Medicine

## 2020-11-03 ENCOUNTER — Other Ambulatory Visit: Payer: Self-pay

## 2020-11-03 ENCOUNTER — Emergency Department: Payer: Medicare Other

## 2020-11-03 ENCOUNTER — Encounter: Payer: Self-pay | Admitting: *Deleted

## 2020-11-03 DIAGNOSIS — I10 Essential (primary) hypertension: Secondary | ICD-10-CM | POA: Diagnosis not present

## 2020-11-03 DIAGNOSIS — W19XXXA Unspecified fall, initial encounter: Secondary | ICD-10-CM | POA: Diagnosis not present

## 2020-11-03 DIAGNOSIS — Z8673 Personal history of transient ischemic attack (TIA), and cerebral infarction without residual deficits: Secondary | ICD-10-CM | POA: Diagnosis not present

## 2020-11-03 DIAGNOSIS — R52 Pain, unspecified: Secondary | ICD-10-CM | POA: Diagnosis not present

## 2020-11-03 DIAGNOSIS — Z7982 Long term (current) use of aspirin: Secondary | ICD-10-CM | POA: Diagnosis not present

## 2020-11-03 DIAGNOSIS — M79605 Pain in left leg: Secondary | ICD-10-CM

## 2020-11-03 DIAGNOSIS — R0902 Hypoxemia: Secondary | ICD-10-CM | POA: Diagnosis not present

## 2020-11-03 DIAGNOSIS — M79662 Pain in left lower leg: Secondary | ICD-10-CM | POA: Diagnosis not present

## 2020-11-03 DIAGNOSIS — Z7401 Bed confinement status: Secondary | ICD-10-CM | POA: Diagnosis not present

## 2020-11-03 DIAGNOSIS — Z79899 Other long term (current) drug therapy: Secondary | ICD-10-CM | POA: Diagnosis not present

## 2020-11-03 DIAGNOSIS — M2550 Pain in unspecified joint: Secondary | ICD-10-CM | POA: Diagnosis not present

## 2020-11-03 DIAGNOSIS — M7989 Other specified soft tissue disorders: Secondary | ICD-10-CM | POA: Diagnosis not present

## 2020-11-03 MED ORDER — ACETAMINOPHEN 500 MG PO TABS
1000.0000 mg | ORAL_TABLET | Freq: Once | ORAL | Status: AC
  Administered 2020-11-03: 1000 mg via ORAL
  Filled 2020-11-03: qty 2

## 2020-11-03 NOTE — ED Provider Notes (Signed)
[facility] Emergency Department Provider Note   ____________________________________________   I have reviewed the triage vital signs and the nursing notes.   HISTORY  Chief Complaint Leg Pain   History limited by: Not Limited   HPI Belinda Hansen is a 74 y.o. female who presents to the emergency department today because of concerns for left leg pain.  She states that she had a fall earlier today.  States it hurts in her left lower leg.  She denies any other injuries.  Denies any chest pain.  Denies any fevers.  Records reviewed. Per medical record review patient has a history of cva, stroke. Has been evaluated for leg pain in the past.   Past Medical History:  Diagnosis Date  . Aortic aneurysm ([place])   . Aphasia   . Hypertension   . Stroke ([place])   . Weakness due to acute stroke [facility])    R sided weakness per family    Patient Active Problem List   Diagnosis Date Noted  . Mixed hyperlipidemia 09/14/2018  . 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 Hemiparesis affecting right side as late effect of cerebrovascular accident (CVA) ([place]) 12/23/2017  . Essential hypertension 12/23/2017  . Wheel chair as ambulatory aid 12/23/2017    Past Surgical History:  Procedure Laterality Date  . ABDOMINAL AORTIC ANEURYSM REPAIR      Prior to Admission medications   Medication Sig Start Date End Date Taking? Authorizing Provider  aspirin 81 MG tablet Take 81 mg by mouth daily.    [provider]  atorvastatin (LIPITOR) 40 MG tablet Take 1 tablet by mouth once daily 10/26/19   Glean Hess, MD  betamethasone dipropionate 0.05 % cream APPLY CREAM TOPICALLY TWICE DAILY AS NEEDED 11/29/19   Glean Hess, MD  lisinopril-hydrochlorothiazide (ZESTORETIC) 10-12.5 MG tablet Take 1 tablet by mouth once daily 10/26/19   Glean Hess, MD  traMADol (ULTRAM) 50 MG tablet Take 1 tablet (50 mg total) by mouth every 12 (twelve)  hours as needed. 12/27/19   Norval Gable, MD    Allergies Patient has no known allergies.  Family History  Family history unknown: Yes    Social History Social History   Tobacco Use  . Smoking status: Never Smoker  . Smokeless tobacco: Never Used  Vaping Use  . Vaping Use: Some days  Substance Use Topics  . Alcohol use: Yes    Comment: beer occasionally  . Drug use: No    Review of Systems Constitutional: No fever/chills Eyes: No visual changes. ENT: No sore throat. Cardiovascular: Denies chest pain. Respiratory: Denies shortness of breath. Gastrointestinal: No abdominal pain.  No nausea, no vomiting.  No diarrhea.   Genitourinary: Negative for dysuria. Musculoskeletal: Positive for left lower leg pain. Skin: Negative for rash. Neurological: Negative for headaches, focal weakness or numbness.  ____________________________________________   PHYSICAL EXAM:  VITAL SIGNS: ED Triage Vitals  Enc Vitals Group     BP 11/03/20 1759 108/85     Pulse Rate 11/03/20 1759 (!) 109     Resp 11/03/20 1759 18     Temp 11/03/20 1759 98.5 F (36.9 C)     Temp Source 11/03/20 1759 Oral     SpO2 11/03/20 1759 100 %     Weight 11/03/20 1800 150 lb (68 kg)     Height 11/03/20 1800 5\' 6"  (1.676 m)     Head Circumference --      Peak Flow --  Pain Score 11/03/20 1800 4   Constitutional: Alert and oriented.  Eyes: Conjunctivae are normal.  ENT      Head: Normocephalic and atraumatic.      Nose: No congestion/rhinnorhea.      Mouth/Throat: Mucous membranes are moist.      Neck: No stridor. Hematological/Lymphatic/Immunilogical: No cervical lymphadenopathy. Cardiovascular: Normal rate, regular rhythm.  No murmurs, rubs, or gallops.  Respiratory: Normal respiratory effort without tachypnea nor retractions. Breath sounds are clear and equal bilaterally. No wheezes/rales/rhonchi. Gastrointestinal: Soft and non tender. No rebound. No guarding.  Genitourinary:  Deferred Musculoskeletal: No edema.  Neurologic:  Aphagia. Sequelae of CVA. Right arm held in contracture.  Skin:  Skin is warm, dry and intact. No rash noted. Psychiatric: Mood and affect are normal. Speech and behavior are normal. Patient exhibits appropriate insight and judgment.  ____________________________________________    LABS (pertinent positives/negatives)  None  ____________________________________________   EKG  None  ____________________________________________    RADIOLOGY  Left tib fib No acute osseous abnormality. Some soft tissue swelling around the ankle  ____________________________________________   PROCEDURES  Procedures  ____________________________________________   INITIAL IMPRESSION / ASSESSMENT AND PLAN / ED COURSE  Pertinent labs & imaging results that were available during my care of the patient were reviewed by me and considered in my medical decision making (see chart for details).   Patient presented to the emergency department today because of concern for left leg pain. The patient states that she did fall. On exam no deformity. X-ray did not show any acute osseous abnormality, did show some swelling around the ankle. At this time doubt DVT. No erythema or warmth to suggest cellulitis. Compartments are soft. Discussed finding of x-ray with patient. Will plan on discharging.  ____________________________________________   FINAL CLINICAL IMPRESSION(S) / ED DIAGNOSES  Final diagnoses:  Left leg pain     Note: This dictation was prepared with Dragon dictation. Any transcriptional errors that result from this process are unintentional     [name], MD 11/03/20 [phone]

## 2020-11-03 NOTE — ED Triage Notes (Signed)
Pt brought in via ems from the [facility].  Pt has left lower leg pain.  No known injury.  No swelling or deformity noted.  Hx cva.  Pt alert.

## 2020-11-03 NOTE — ED Notes (Signed)
Rectal exam performed by MD with this RN as witness. Hemoccult negative for blood on exam. No blood noted on bed or in patient brief.

## 2020-11-03 NOTE — ED Notes (Signed)
Pt brought in via ems from the [facility].  Pt reports left lower leg pain for a long time.  No known injury.  Per ems , pt asked for tylenol at the nursing home and they could not give it.  Pt was sent to er for eval.  Pt alert.  Hx cva.  siderails up x 2

## 2020-11-03 NOTE — Discharge Instructions (Addendum)
Please seek medical attention for any high fevers, chest pain, shortness of breath, change in behavior, persistent vomiting, bloody stool or any other new or concerning symptoms.  

## 2020-11-03 NOTE — ED Notes (Signed)
Report off to [employer]

## 2020-11-03 NOTE — ED Notes (Signed)
Patient's brief and linens changed. Peri care performed.

## 2020-11-03 NOTE — ED Notes (Signed)
Attempted to contact patient's family with update regarding rectal exam requested. No answer at this time and RN unable to leave a voicemail as the mailbox was not set up.

## 2020-11-03 NOTE — ED Notes (Signed)
Patient family called for update. Results of evaluation were reviewed with family and all questions answered. Family then voiced concern for patient having a rectal bleed due to finding spots of blood in patient's bed. Family requesting evaluation for bleed. MD made aware.

## 2020-11-05 DIAGNOSIS — I69351 Hemiplegia and hemiparesis following cerebral infarction affecting right dominant side: Secondary | ICD-10-CM | POA: Diagnosis not present

## 2020-11-05 DIAGNOSIS — I10 Essential (primary) hypertension: Secondary | ICD-10-CM | POA: Diagnosis not present

## 2020-11-05 DIAGNOSIS — Z20828 Contact with and (suspected) exposure to other viral communicable diseases: Secondary | ICD-10-CM | POA: Diagnosis not present

## 2020-11-05 DIAGNOSIS — R296 Repeated falls: Secondary | ICD-10-CM | POA: Diagnosis not present

## 2020-11-05 DIAGNOSIS — I69320 Aphasia following cerebral infarction: Secondary | ICD-10-CM | POA: Diagnosis not present

## 2020-11-05 DIAGNOSIS — N390 Urinary tract infection, site not specified: Secondary | ICD-10-CM | POA: Diagnosis not present

## 2020-11-06 DIAGNOSIS — I6990 Unspecified sequelae of unspecified cerebrovascular disease: Secondary | ICD-10-CM | POA: Diagnosis not present

## 2020-11-06 DIAGNOSIS — I69351 Hemiplegia and hemiparesis following cerebral infarction affecting right dominant side: Secondary | ICD-10-CM | POA: Diagnosis not present

## 2020-11-06 DIAGNOSIS — L209 Atopic dermatitis, unspecified: Secondary | ICD-10-CM | POA: Diagnosis not present

## 2020-11-06 DIAGNOSIS — I69320 Aphasia following cerebral infarction: Secondary | ICD-10-CM | POA: Diagnosis not present

## 2020-11-06 DIAGNOSIS — R4701 Aphasia: Secondary | ICD-10-CM | POA: Diagnosis not present

## 2020-11-06 DIAGNOSIS — R296 Repeated falls: Secondary | ICD-10-CM | POA: Diagnosis not present

## 2020-11-06 DIAGNOSIS — N390 Urinary tract infection, site not specified: Secondary | ICD-10-CM | POA: Diagnosis not present

## 2020-11-06 DIAGNOSIS — I10 Essential (primary) hypertension: Secondary | ICD-10-CM | POA: Diagnosis not present

## 2020-11-12 DIAGNOSIS — R296 Repeated falls: Secondary | ICD-10-CM | POA: Diagnosis not present

## 2020-11-12 DIAGNOSIS — I69351 Hemiplegia and hemiparesis following cerebral infarction affecting right dominant side: Secondary | ICD-10-CM | POA: Diagnosis not present

## 2020-11-12 DIAGNOSIS — I69320 Aphasia following cerebral infarction: Secondary | ICD-10-CM | POA: Diagnosis not present

## 2020-11-12 DIAGNOSIS — I10 Essential (primary) hypertension: Secondary | ICD-10-CM | POA: Diagnosis not present

## 2020-11-12 DIAGNOSIS — N390 Urinary tract infection, site not specified: Secondary | ICD-10-CM | POA: Diagnosis not present

## 2020-11-14 DIAGNOSIS — N390 Urinary tract infection, site not specified: Secondary | ICD-10-CM | POA: Diagnosis not present

## 2020-11-14 DIAGNOSIS — R296 Repeated falls: Secondary | ICD-10-CM | POA: Diagnosis not present

## 2020-11-14 DIAGNOSIS — I69351 Hemiplegia and hemiparesis following cerebral infarction affecting right dominant side: Secondary | ICD-10-CM | POA: Diagnosis not present

## 2020-11-14 DIAGNOSIS — I10 Essential (primary) hypertension: Secondary | ICD-10-CM | POA: Diagnosis not present

## 2020-11-14 DIAGNOSIS — I69320 Aphasia following cerebral infarction: Secondary | ICD-10-CM | POA: Diagnosis not present

## 2020-11-19 DIAGNOSIS — I69320 Aphasia following cerebral infarction: Secondary | ICD-10-CM | POA: Diagnosis not present

## 2020-11-19 DIAGNOSIS — I10 Essential (primary) hypertension: Secondary | ICD-10-CM | POA: Diagnosis not present

## 2020-11-19 DIAGNOSIS — I69351 Hemiplegia and hemiparesis following cerebral infarction affecting right dominant side: Secondary | ICD-10-CM | POA: Diagnosis not present

## 2020-11-19 DIAGNOSIS — N390 Urinary tract infection, site not specified: Secondary | ICD-10-CM | POA: Diagnosis not present

## 2020-11-19 DIAGNOSIS — R296 Repeated falls: Secondary | ICD-10-CM | POA: Diagnosis not present

## 2020-11-21 DIAGNOSIS — E559 Vitamin D deficiency, unspecified: Secondary | ICD-10-CM | POA: Diagnosis not present

## 2020-11-21 DIAGNOSIS — D518 Other vitamin B12 deficiency anemias: Secondary | ICD-10-CM | POA: Diagnosis not present

## 2020-11-21 DIAGNOSIS — E038 Other specified hypothyroidism: Secondary | ICD-10-CM | POA: Diagnosis not present

## 2020-11-21 DIAGNOSIS — I10 Essential (primary) hypertension: Secondary | ICD-10-CM | POA: Diagnosis not present

## 2020-11-21 DIAGNOSIS — E785 Hyperlipidemia, unspecified: Secondary | ICD-10-CM | POA: Diagnosis not present

## 2020-11-21 DIAGNOSIS — E119 Type 2 diabetes mellitus without complications: Secondary | ICD-10-CM | POA: Diagnosis not present

## 2020-11-22 DIAGNOSIS — I69351 Hemiplegia and hemiparesis following cerebral infarction affecting right dominant side: Secondary | ICD-10-CM | POA: Diagnosis not present

## 2020-11-22 DIAGNOSIS — R296 Repeated falls: Secondary | ICD-10-CM | POA: Diagnosis not present

## 2020-11-22 DIAGNOSIS — N390 Urinary tract infection, site not specified: Secondary | ICD-10-CM | POA: Diagnosis not present

## 2020-11-22 DIAGNOSIS — I69320 Aphasia following cerebral infarction: Secondary | ICD-10-CM | POA: Diagnosis not present

## 2020-11-22 DIAGNOSIS — I10 Essential (primary) hypertension: Secondary | ICD-10-CM | POA: Diagnosis not present

## 2020-11-23 DIAGNOSIS — B351 Tinea unguium: Secondary | ICD-10-CM | POA: Diagnosis not present

## 2020-11-26 DIAGNOSIS — I69351 Hemiplegia and hemiparesis following cerebral infarction affecting right dominant side: Secondary | ICD-10-CM | POA: Diagnosis not present

## 2020-11-27 DIAGNOSIS — Z23 Encounter for immunization: Secondary | ICD-10-CM | POA: Diagnosis not present

## 2020-11-29 ENCOUNTER — Telehealth: Payer: Self-pay

## 2020-11-29 NOTE — Telephone Encounter (Signed)
Patient has not been seen in over 6 months with Dr. Army Melia. She needs to schedule a follow up. Patient is non verbal so please call Belinda Hansen to schedule this follow up in January some time.  Thank you.

## 2020-12-04 DIAGNOSIS — L309 Dermatitis, unspecified: Secondary | ICD-10-CM | POA: Diagnosis not present

## 2020-12-04 DIAGNOSIS — I10 Essential (primary) hypertension: Secondary | ICD-10-CM | POA: Diagnosis not present

## 2020-12-04 DIAGNOSIS — I6990 Unspecified sequelae of unspecified cerebrovascular disease: Secondary | ICD-10-CM | POA: Diagnosis not present

## 2020-12-04 DIAGNOSIS — R4701 Aphasia: Secondary | ICD-10-CM | POA: Diagnosis not present

## 2020-12-04 DIAGNOSIS — E785 Hyperlipidemia, unspecified: Secondary | ICD-10-CM | POA: Diagnosis not present

## 2021-01-03 ENCOUNTER — Other Ambulatory Visit: Payer: Self-pay

## 2021-01-03 ENCOUNTER — Ambulatory Visit (INDEPENDENT_AMBULATORY_CARE_PROVIDER_SITE_OTHER): Payer: Medicare Other | Admitting: Podiatry

## 2021-01-03 ENCOUNTER — Encounter: Payer: Self-pay | Admitting: Podiatry

## 2021-01-03 DIAGNOSIS — M79675 Pain in left toe(s): Secondary | ICD-10-CM

## 2021-01-03 DIAGNOSIS — B351 Tinea unguium: Secondary | ICD-10-CM

## 2021-01-03 DIAGNOSIS — M79674 Pain in right toe(s): Secondary | ICD-10-CM
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
## 2021-03-04 ENCOUNTER — Ambulatory Visit: Payer: Medicare Other | Admitting: Dermatology

## 2021-04-03 IMAGING — CR DG SHOULDER 2+V*R*
1 series · 4 of 4 positions shown · non-contrast
Comparison: No recent prior.

CLINICAL DATA: Pain secondary to fall.

EXAM:
RIGHT SHOULDER - 2+ VIEW

[Series 1: dg shoulder right · 0.14mm/px · 4 of 4 slices shown]
[im 1/4]
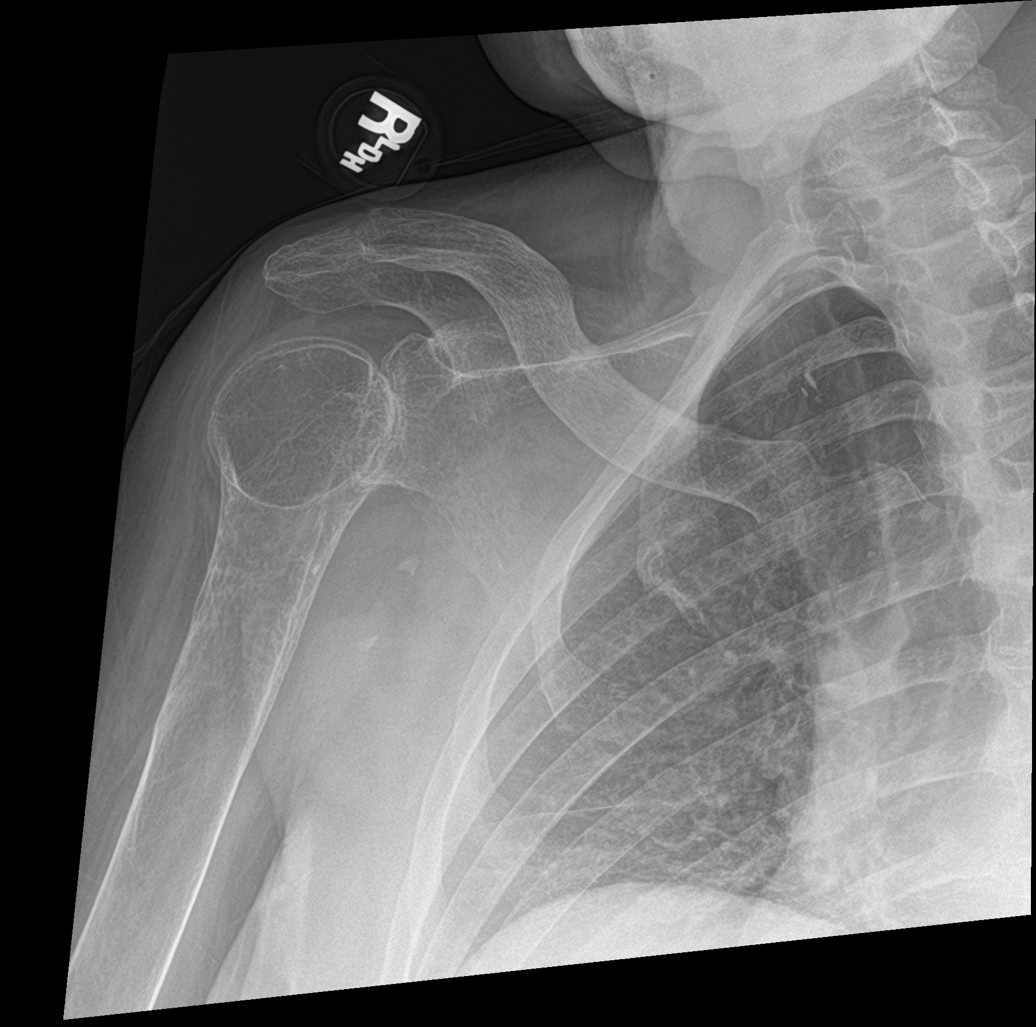
[im 2/4]
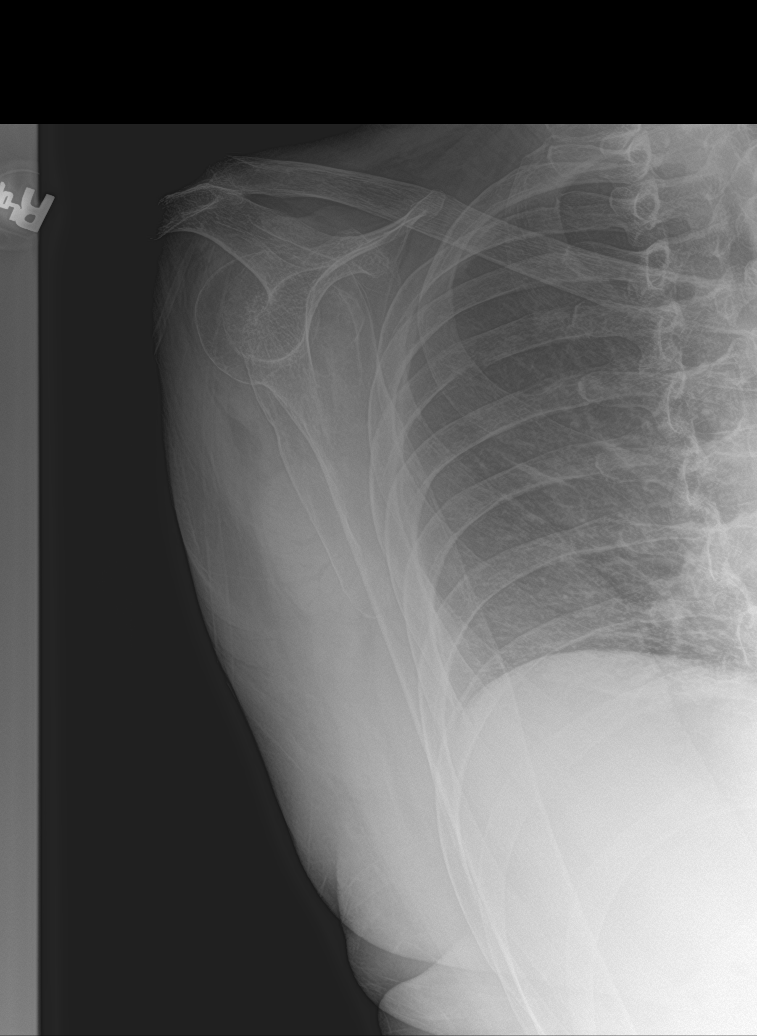
[im 3/4]
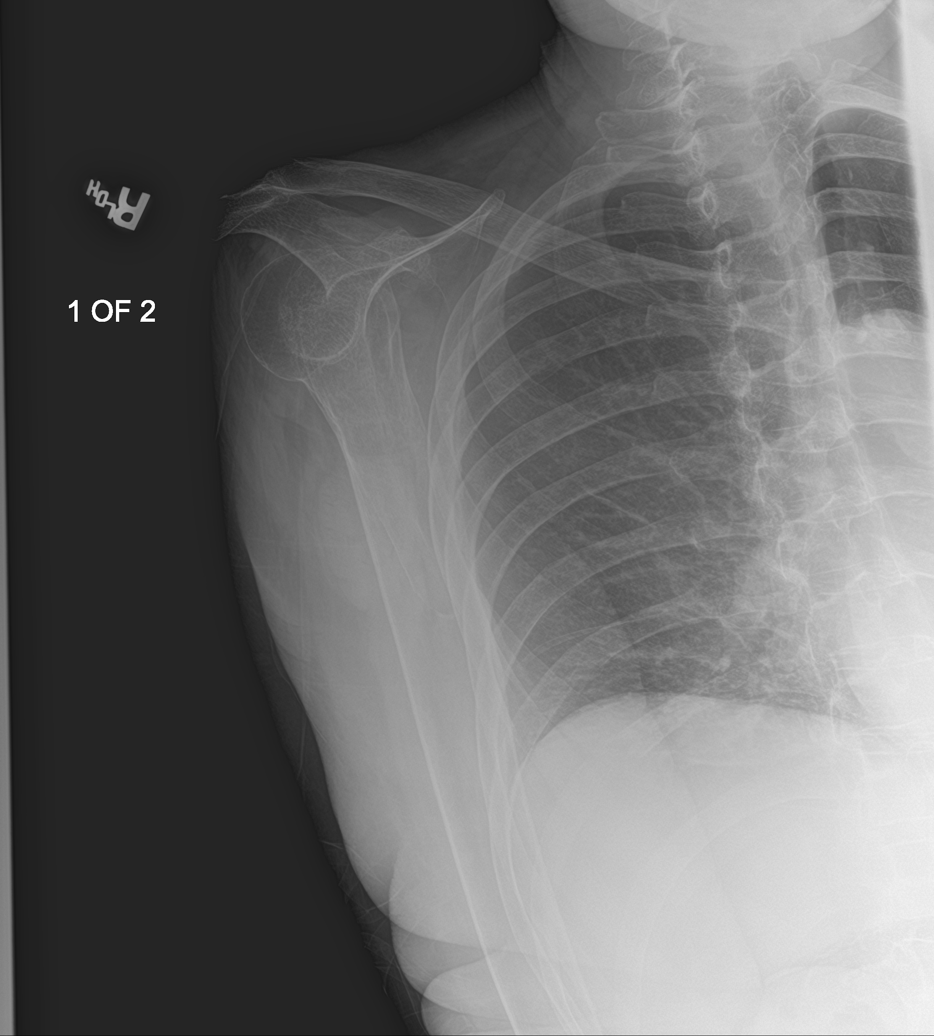
[im 4/4]
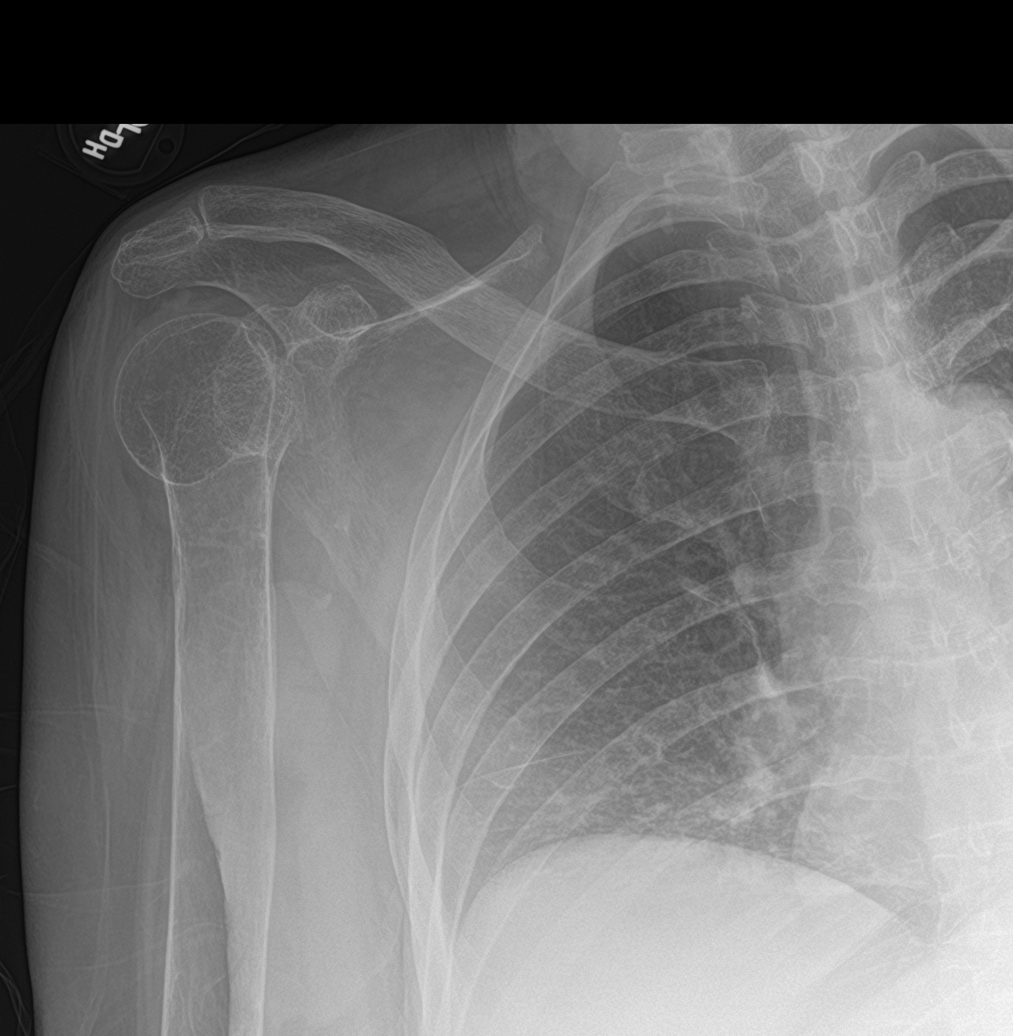

[4 of 4 positions shown; findings below may reference images not displayed]

FINDINGS: Diffuse osteopenia. Acromioclavicular and glenohumeral degenerative
change. No evidence of fracture, dislocation, or separation.
Peripheral vascular calcification.
IMPRESSION: 1. Diffuse osteopenia. Acromioclavicular glenohumeral degenerative
change. No acute abnormality identified.

2.  Peripheral vascular disease.

## 2021-09-05 IMAGING — DX DG TIBIA/FIBULA 2V*L*
4 series · 4 of 4 positions shown · non-contrast
Comparison: None.

CLINICAL DATA: Lower extremity pain

EXAM:
LEFT TIBIA AND FIBULA - 2 VIEW

[tibia ap (1 of 2)]
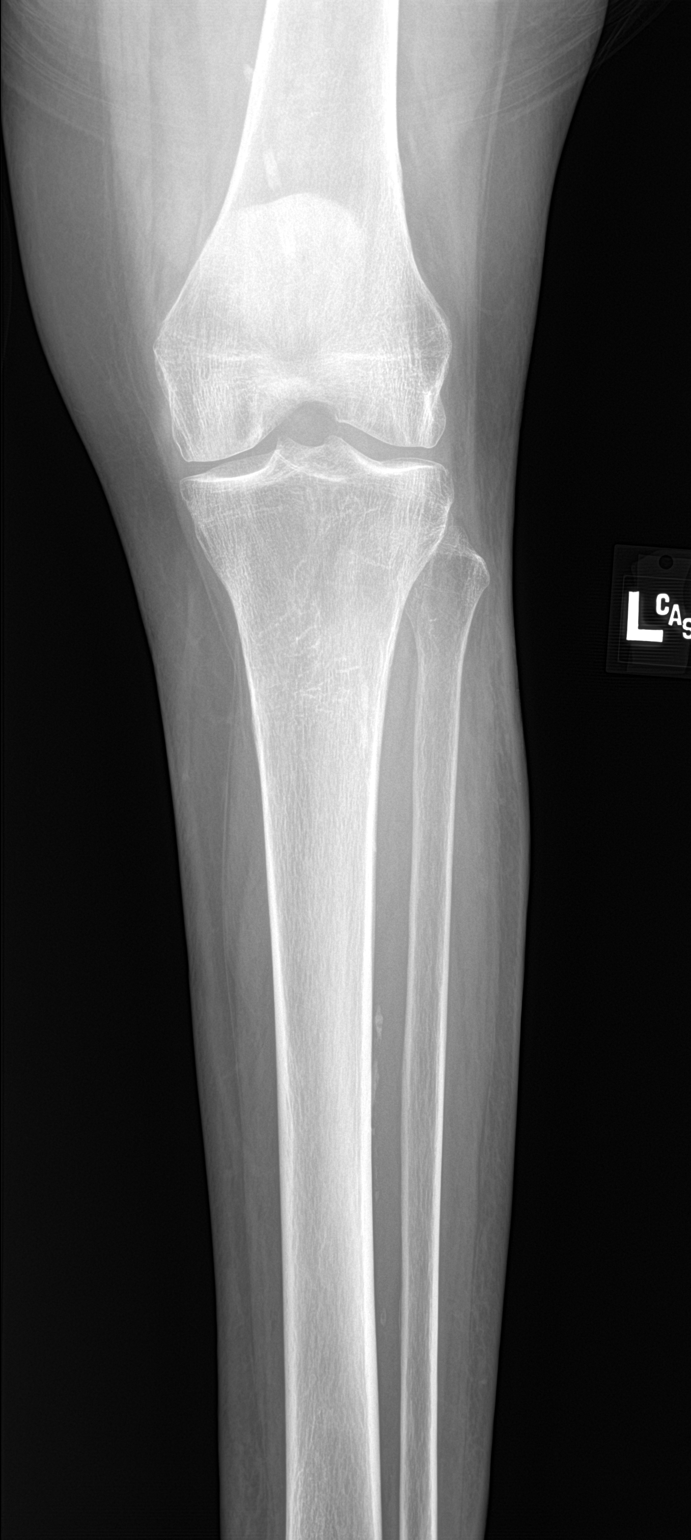

[tibia ap (2 of 2)]
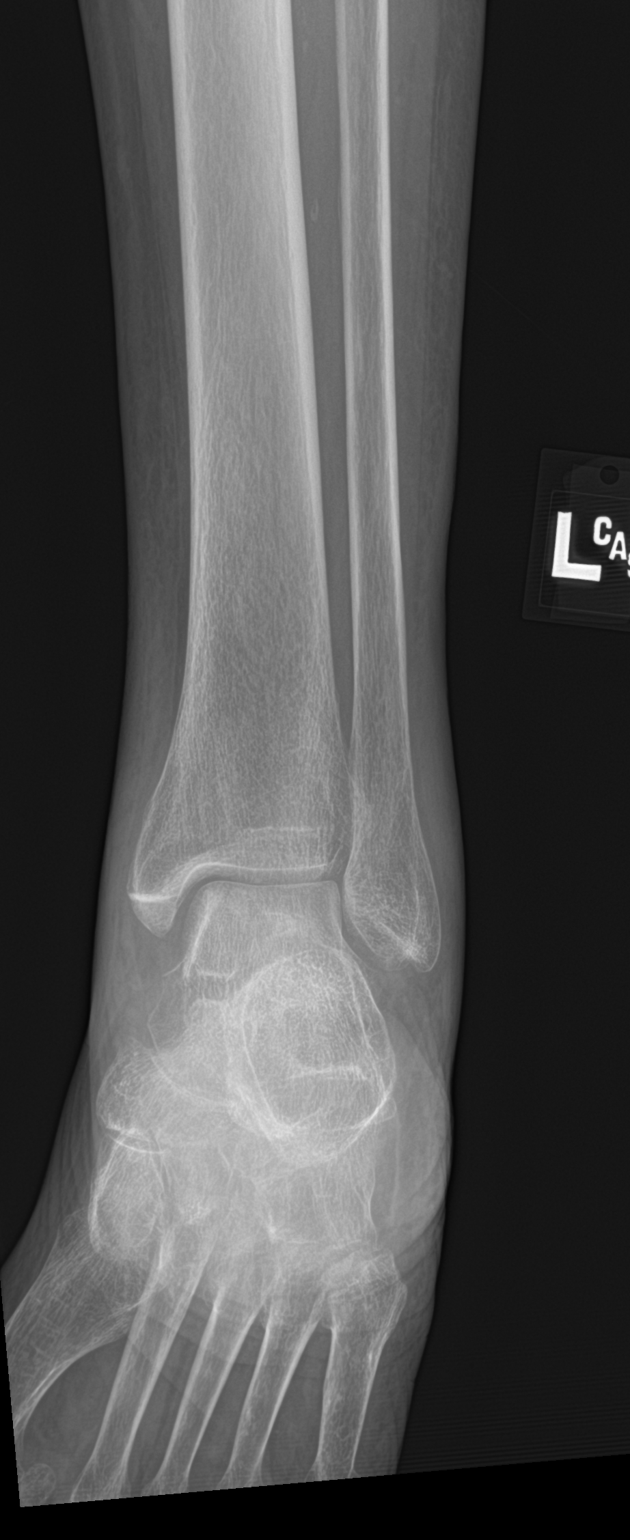

[tibia lat (1 of 2)]
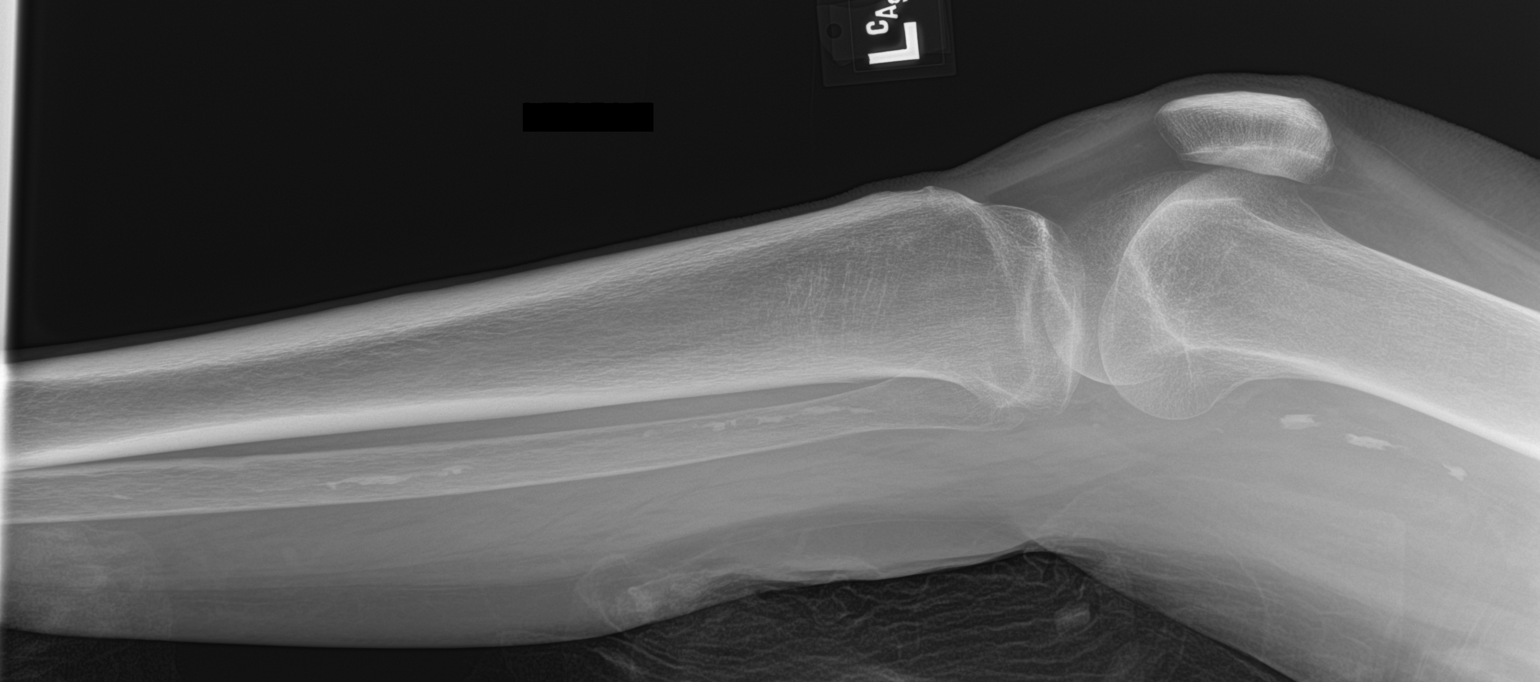

[tibia lat (2 of 2)]
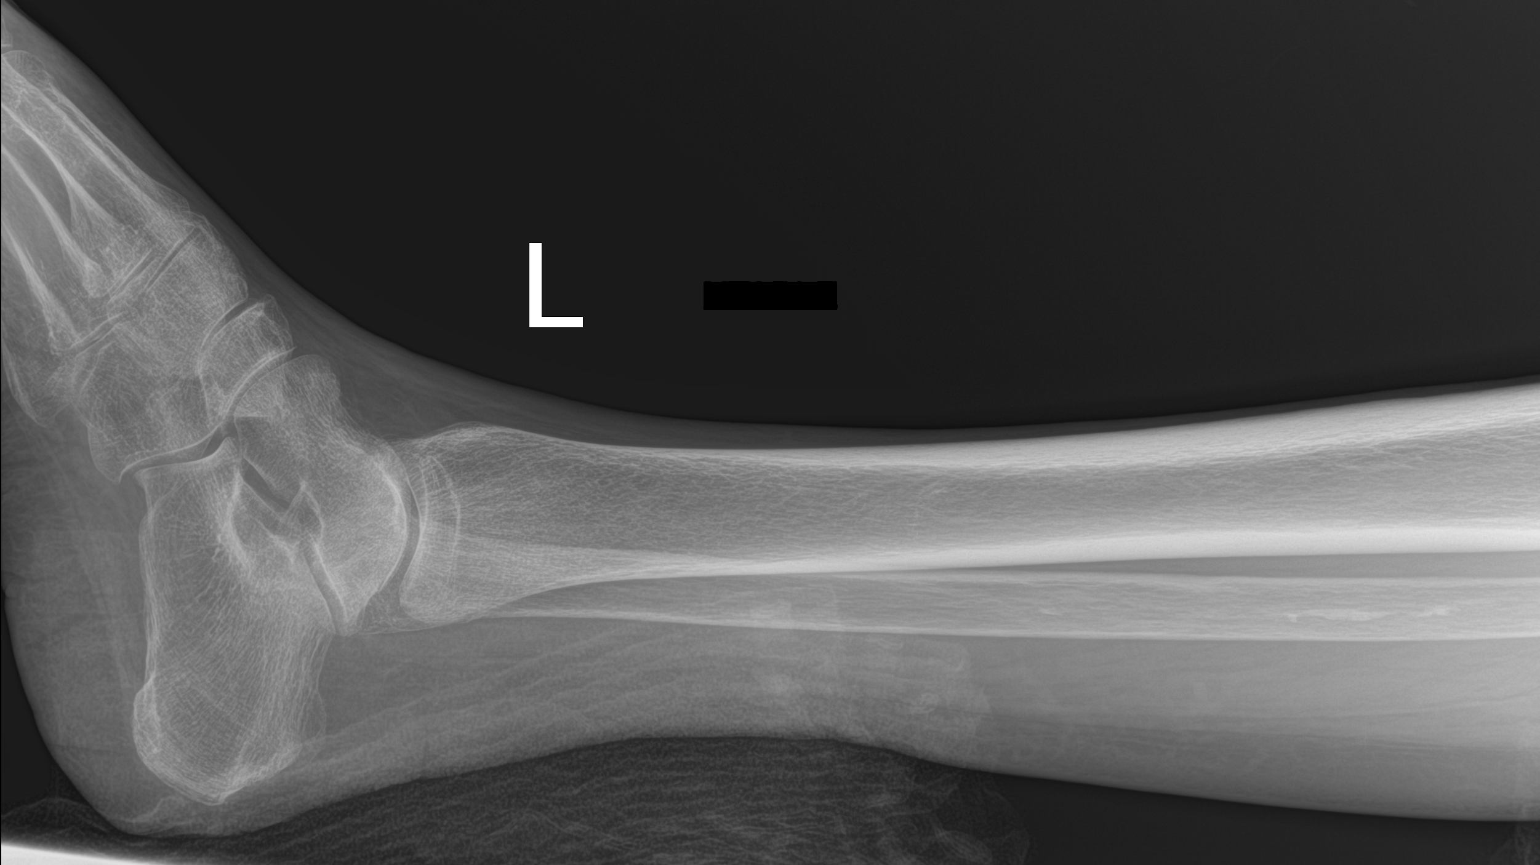

[4 of 4 positions shown; findings below may reference images not displayed]

FINDINGS: There is no evidence of fracture or other focal bone lesions. Soft
tissue swelling seen diffusely around the ankle. Scattered dense
vascular calcifications are noted.
IMPRESSION: No acute osseous abnormality. Soft tissue swelling seen around the
ankle.
